# Patient Record
Sex: Male | Born: 1965
Health system: Southern US, Community
[De-identification: ages and names within clinical notes are randomized; demographics above are authoritative.]

## PROBLEM LIST (undated history)

## (undated) ENCOUNTER — Ambulatory Visit (HOSPITAL_COMMUNITY): Discharge status: Absent without leave | Payer: Self-pay | Source: Home / Self Care

## (undated) DIAGNOSIS — F419 Anxiety disorder, unspecified: Secondary | ICD-10-CM

## (undated) DIAGNOSIS — K219 Gastro-esophageal reflux disease without esophagitis: Secondary | ICD-10-CM

## (undated) DIAGNOSIS — F32A Depression, unspecified: Secondary | ICD-10-CM

## (undated) DIAGNOSIS — R011 Cardiac murmur, unspecified: Secondary | ICD-10-CM

## (undated) DIAGNOSIS — F329 Major depressive disorder, single episode, unspecified: Secondary | ICD-10-CM

## (undated) DIAGNOSIS — F1111 Opioid abuse, in remission: Secondary | ICD-10-CM

## (undated) DIAGNOSIS — F1120 Opioid dependence, uncomplicated: Secondary | ICD-10-CM

## (undated) DIAGNOSIS — Z79891 Long term (current) use of opiate analgesic: Secondary | ICD-10-CM

## (undated) HISTORY — PX: NO PAST SURGERIES: SHX2092

---
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
## 2009-10-16 ENCOUNTER — Emergency Department (HOSPITAL_COMMUNITY): Admission: EM | Admit: 2009-10-16 | Discharge: 2009-10-16 | Payer: Self-pay | Admitting: Emergency Medicine

## 2009-12-05 ENCOUNTER — Emergency Department (HOSPITAL_COMMUNITY): Admission: EM | Admit: 2009-12-05 | Discharge: 2009-12-05 | Payer: Self-pay | Admitting: Emergency Medicine

## 2011-03-14 ENCOUNTER — Emergency Department (HOSPITAL_COMMUNITY)
Admission: EM | Admit: 2011-03-14 | Discharge: 2011-03-14 | Disposition: A | Discharge status: Home / Self Care | Payer: Self-pay | Attending: Emergency Medicine | Admitting: Emergency Medicine

## 2011-03-14 ENCOUNTER — Emergency Department (HOSPITAL_COMMUNITY): Payer: Self-pay

## 2011-03-14 DIAGNOSIS — IMO0001 Reserved for inherently not codable concepts without codable children: Secondary | ICD-10-CM | POA: Insufficient documentation

## 2011-03-14 DIAGNOSIS — R12 Heartburn: Secondary | ICD-10-CM | POA: Insufficient documentation

## 2011-03-14 DIAGNOSIS — K8020 Calculus of gallbladder without cholecystitis without obstruction: Secondary | ICD-10-CM | POA: Insufficient documentation

## 2011-03-14 DIAGNOSIS — K219 Gastro-esophageal reflux disease without esophagitis: Secondary | ICD-10-CM | POA: Insufficient documentation

## 2011-03-14 DIAGNOSIS — R112 Nausea with vomiting, unspecified: Secondary | ICD-10-CM | POA: Insufficient documentation

## 2011-03-14 DIAGNOSIS — Z79899 Other long term (current) drug therapy: Secondary | ICD-10-CM | POA: Insufficient documentation

## 2011-03-14 DIAGNOSIS — R109 Unspecified abdominal pain: Secondary | ICD-10-CM | POA: Insufficient documentation

## 2011-03-14 LAB — URINALYSIS, ROUTINE W REFLEX MICROSCOPIC
Glucose, UA: NEGATIVE mg/dL
Ketones, ur: NEGATIVE mg/dL
Nitrite: NEGATIVE
Specific Gravity, Urine: >1.046 — ABNORMAL HIGH (ref 1.005–1.030)
pH: 6 (ref 5.0–8.0)

## 2011-03-14 LAB — DIFFERENTIAL
Basophils Absolute: 0 10*3/uL (ref 0.0–0.1)
Eosinophils Absolute: 0.4 10*3/uL (ref 0.0–0.7)
Eosinophils Relative: 5 % (ref 0–5)
Monocytes Absolute: 0.6 10*3/uL (ref 0.1–1.0)

## 2011-03-14 LAB — CBC
MCHC: 35.6 g/dL (ref 30.0–36.0)
MCV: 88.7 fL (ref 78.0–100.0)
Platelets: 174 10*3/uL (ref 150–400)
RDW: 13.2 % (ref 11.5–15.5)
WBC: 8.8 10*3/uL (ref 4.0–10.5)

## 2011-03-14 LAB — COMPREHENSIVE METABOLIC PANEL
AST: 19 U/L (ref 0–37)
Albumin: 4 g/dL (ref 3.5–5.2)
BUN: 14 mg/dL (ref 6–23)
Chloride: 101 mEq/L (ref 96–112)
Creatinine, Ser: 1.22 mg/dL (ref 0.50–1.35)
Potassium: 3.7 mEq/L (ref 3.5–5.1)
Total Bilirubin: 0.2 mg/dL — ABNORMAL LOW (ref 0.3–1.2)
Total Protein: 7.5 g/dL (ref 6.0–8.3)

## 2011-03-14 LAB — LIPASE, BLOOD: Lipase: 39 U/L (ref 11–59)

## 2011-03-14 MED ORDER — IOHEXOL 300 MG/ML  SOLN
100.0000 mL | Freq: Once | INTRAMUSCULAR | Status: AC | PRN
  Administered 2011-03-14: 100 mL via INTRAVENOUS

## 2015-03-22 ENCOUNTER — Emergency Department (HOSPITAL_COMMUNITY)
Admission: EM | Admit: 2015-03-22 | Discharge: 2015-03-22 | Disposition: A | Discharge status: Home / Self Care | Payer: Self-pay | Attending: Emergency Medicine | Admitting: Emergency Medicine

## 2015-03-22 ENCOUNTER — Encounter (HOSPITAL_COMMUNITY): Payer: Self-pay | Admitting: Emergency Medicine

## 2015-03-22 DIAGNOSIS — E860 Dehydration: Secondary | ICD-10-CM | POA: Insufficient documentation

## 2015-03-22 DIAGNOSIS — Z720 Tobacco use: Secondary | ICD-10-CM | POA: Insufficient documentation

## 2015-03-22 DIAGNOSIS — Z79899 Other long term (current) drug therapy: Secondary | ICD-10-CM | POA: Insufficient documentation

## 2015-03-22 LAB — LIPASE, BLOOD: Lipase: 17 U/L — ABNORMAL LOW (ref 22–51)

## 2015-03-22 LAB — URINALYSIS, ROUTINE W REFLEX MICROSCOPIC
Glucose, UA: NEGATIVE mg/dL
Hgb urine dipstick: NEGATIVE
Ketones, ur: NEGATIVE mg/dL
Leukocytes, UA: NEGATIVE
Nitrite: NEGATIVE
Protein, ur: 30 mg/dL — AB
Specific Gravity, Urine: 1.027 (ref 1.005–1.030)
Urobilinogen, UA: 1 mg/dL (ref 0.0–1.0)
pH: 6.5 (ref 5.0–8.0)

## 2015-03-22 LAB — COMPREHENSIVE METABOLIC PANEL
ALT: 45 U/L (ref 17–63)
AST: 52 U/L — ABNORMAL HIGH (ref 15–41)
Albumin: 4.4 g/dL (ref 3.5–5.0)
Alkaline Phosphatase: 97 U/L (ref 38–126)
Anion gap: 11 (ref 5–15)
BUN: 11 mg/dL (ref 6–20)
CO2: 27 mmol/L (ref 22–32)
Calcium: 10.1 mg/dL (ref 8.9–10.3)
Chloride: 103 mmol/L (ref 101–111)
Creatinine, Ser: 1.44 mg/dL — ABNORMAL HIGH (ref 0.61–1.24)
GFR calc Af Amer: >60 mL/min (ref >60)
GFR calc non Af Amer: 57 mL/min — ABNORMAL LOW (ref >60)
Glucose, Bld: 109 mg/dL — ABNORMAL HIGH (ref 65–99)
Potassium: 4.5 mmol/L (ref 3.5–5.1)
Sodium: 141 mmol/L (ref 135–145)
Total Bilirubin: 1 mg/dL (ref 0.3–1.2)
Total Protein: 9 g/dL — ABNORMAL HIGH (ref 6.5–8.1)

## 2015-03-22 LAB — CBC WITH DIFFERENTIAL/PLATELET
Basophils Absolute: 0 10*3/uL (ref 0.0–0.1)
Basophils Relative: 0 % (ref 0–1)
Eosinophils Absolute: 0 10*3/uL (ref 0.0–0.7)
Eosinophils Relative: 0 % (ref 0–5)
HCT: 48.5 % (ref 39.0–52.0)
Hemoglobin: 16.7 g/dL (ref 13.0–17.0)
Lymphocytes Relative: 15 % (ref 12–46)
Lymphs Abs: 2.1 10*3/uL (ref 0.7–4.0)
MCH: 31 pg (ref 26.0–34.0)
MCHC: 34.4 g/dL (ref 30.0–36.0)
MCV: 90 fL (ref 78.0–100.0)
Monocytes Absolute: 0.8 10*3/uL (ref 0.1–1.0)
Monocytes Relative: 6 % (ref 3–12)
Neutro Abs: 11.3 10*3/uL — ABNORMAL HIGH (ref 1.7–7.7)
Neutrophils Relative %: 79 % — ABNORMAL HIGH (ref 43–77)
Platelets: 212 10*3/uL (ref 150–400)
RBC: 5.39 MIL/uL (ref 4.22–5.81)
RDW: 13.1 % (ref 11.5–15.5)
WBC: 14.3 10*3/uL — ABNORMAL HIGH (ref 4.0–10.5)

## 2015-03-22 LAB — URINE MICROSCOPIC-ADD ON

## 2015-03-22 MED ORDER — ONDANSETRON HCL 4 MG/2ML IJ SOLN
4.0000 mg | Freq: Once | INTRAMUSCULAR | Status: AC
  Administered 2015-03-22: 4 mg via INTRAVENOUS
  Filled 2015-03-22: qty 2

## 2015-03-22 MED ORDER — SODIUM CHLORIDE 0.9 % IV BOLUS (SEPSIS)
1000.0000 mL | Freq: Once | INTRAVENOUS | Status: AC
  Administered 2015-03-22: 1000 mL via INTRAVENOUS

## 2015-03-22 MED ORDER — PROMETHAZINE HCL 25 MG PO TABS: 25.0000 mg | ORAL_TABLET | Freq: Three times a day (TID) | ORAL | Status: DC | PRN

## 2015-03-22 NOTE — ED Notes (Signed)
Pt ambulated without difficulty

## 2015-03-22 NOTE — Discharge Instructions (Signed)
Return here as needed.  Follow-up with your primary care doctor °

## 2015-03-22 NOTE — ED Notes (Signed)
Per EMS, pt from home, pt reports he is a recovering opiate addict, took his methadone with cocaine today and became nauseous and dizzy with diaphoresis.  Pt is A&OX 4.

## 2015-03-22 NOTE — ED Provider Notes (Signed)
CSN: [id number]     Arrival date & time 03/22/15  1747 History   First MD Initiated Contact with Patient 03/22/15 1840     Chief Complaint  Patient presents with  . Nausea  . Dizziness     (Consider location/radiation/quality/duration/timing/severity/associated sxs/prior Treatment) HPI Patient presents to the emergency department with nausea and dizziness that started today.  The patient states that he is on methadone for heroin and opiate usage.  The patient states that that he has not had any chest pain, shortness of breath, vomiting, diarrhea, abdominal pain, weakness, headache, blurred vision, back pain, incontinence, dysuria, bloody stool, hematemesis, anorexia, rash, near syncope or syncope.  The patient states that he did not take any medications prior to arrival.  He was concerned that this was a result of his methadone.  Patient has maintained the same dosage as he has always had.  He states that he had his methadone clinic this morning. History reviewed. No pertinent past medical history. History reviewed. No pertinent past surgical history. No family history on file. History  Substance Use Topics  . Smoking status: Current Every Day Smoker    Types: Cigarettes  . Smokeless tobacco: Not on file  . Alcohol Use: No    Review of Systems All other systems negative except as documented in the HPI. All pertinent positives and negatives as reviewed in the HPI.    Allergies  Review of patient's allergies indicates no known allergies.  Home Medications   Prior to Admission medications   Medication Sig Start Date End Date Taking? Authorizing Provider  citalopram (CELEXA) 40 MG tablet Take 40 mg by mouth daily.   Yes Historical Provider, MD  docusate sodium (COLACE) 100 MG capsule Take 100 mg by mouth daily as needed for mild constipation.   Yes Historical Provider, MD  methadone (DOLOPHINE) 10 MG/5ML solution Take 73 mg by mouth every morning.   Yes Historical Provider, MD   polyethylene glycol (MIRALAX / GLYCOLAX) packet Take 17 g by mouth daily.   Yes Historical Provider, MD   BP 148/86 mmHg  Pulse 95  Temp(Src) 98.8 F (37.1 C) (Oral)  Resp 18  SpO2 96% Physical Exam  Constitutional: He is oriented to person, place, and time. He appears well-developed and well-nourished. No distress.  HENT:  Head: Normocephalic and atraumatic.  Mouth/Throat: Oropharynx is clear and moist.  Eyes: Pupils are equal, round, and reactive to light.  Neck: Normal range of motion. Neck supple.  Cardiovascular: Normal rate, regular rhythm and normal heart sounds.  Exam reveals no gallop and no friction rub.   No murmur heard. Pulmonary/Chest: Effort normal and breath sounds normal. No respiratory distress.  Abdominal: Soft. Bowel sounds are normal. He exhibits no distension. There is no tenderness.  Musculoskeletal: He exhibits no edema.  Neurological: He is alert and oriented to person, place, and time. He exhibits normal muscle tone. Coordination normal.  Skin: Skin is warm and dry. No rash noted. No erythema.  Psychiatric: He has a normal mood and affect. His behavior is normal.  Nursing note and vitals reviewed.   ED Course  Procedures (including critical care time) Labs Review Labs Reviewed  COMPREHENSIVE METABOLIC PANEL - Abnormal; Notable for the following:    Glucose, Bld 109 (*)    Creatinine, Ser 1.44 (*)    Total Protein 9.0 (*)    AST 52 (*)    GFR calc non Af Amer 57 (*)    All other components within normal limits  CBC  WITH DIFFERENTIAL/PLATELET - Abnormal; Notable for the following:    WBC 14.3 (*)    Neutrophils Relative % 79 (*)    Neutro Abs 11.3 (*)    All other components within normal limits  LIPASE, BLOOD - Abnormal; Notable for the following:    Lipase 17 (*)    All other components within normal limits  URINALYSIS, ROUTINE W REFLEX MICROSCOPIC (NOT AT [facility]) - Abnormal; Notable for the following:    Color, Urine AMBER (*)    APPearance  CLOUDY (*)    Bilirubin Urine SMALL (*)    Protein, ur 30 (*)    All other components within normal limits  URINE MICROSCOPIC-ADD ON - Abnormal; Notable for the following:    Casts HYALINE CASTS (*)    All other components within normal limits    Patient has been stable here in the emergency department.  He ambulated without difficulties.  Orthostatics do not show any abnormalities.  A tolerated oral fluids.  I am not exactly sure what caused the patient's nausea and dizziness other than he may be somewhat dehydrated based on his labs.  Patient is advised to increase his fluid intake.  He received 2 L of fluid here in the emergency department    Charlestine Night, PA-C 03/22/15 2246  Eber Hong, MD 03/23/15 1630

## 2018-04-25 NOTE — Congregational Nurse Program (Signed)
New client. Expressed concerns about diabetes.  States runs in his family, but he has never been dx.  Plans to have Dr. Christin FudgeSteele check on next visit

## 2019-12-26 ENCOUNTER — Emergency Department (HOSPITAL_COMMUNITY): Payer: Self-pay

## 2019-12-26 ENCOUNTER — Emergency Department (HOSPITAL_COMMUNITY)
Admission: EM | Admit: 2019-12-26 | Discharge: 2019-12-26 | Disposition: A | Discharge status: Home / Self Care | Payer: Self-pay | Attending: Emergency Medicine | Admitting: Emergency Medicine

## 2019-12-26 ENCOUNTER — Other Ambulatory Visit: Payer: Self-pay

## 2019-12-26 DIAGNOSIS — Y929 Unspecified place or not applicable: Secondary | ICD-10-CM | POA: Insufficient documentation

## 2019-12-26 DIAGNOSIS — Y999 Unspecified external cause status: Secondary | ICD-10-CM | POA: Insufficient documentation

## 2019-12-26 DIAGNOSIS — F17210 Nicotine dependence, cigarettes, uncomplicated: Secondary | ICD-10-CM | POA: Insufficient documentation

## 2019-12-26 DIAGNOSIS — S82441A Displaced spiral fracture of shaft of right fibula, initial encounter for closed fracture: Secondary | ICD-10-CM | POA: Insufficient documentation

## 2019-12-26 DIAGNOSIS — Y9301 Activity, walking, marching and hiking: Secondary | ICD-10-CM | POA: Insufficient documentation

## 2019-12-26 DIAGNOSIS — Z20822 Contact with and (suspected) exposure to covid-19: Secondary | ICD-10-CM | POA: Insufficient documentation

## 2019-12-26 DIAGNOSIS — S82891A Other fracture of right lower leg, initial encounter for closed fracture: Secondary | ICD-10-CM

## 2019-12-26 DIAGNOSIS — X58XXXA Exposure to other specified factors, initial encounter: Secondary | ICD-10-CM | POA: Insufficient documentation

## 2019-12-26 LAB — RESPIRATORY PANEL BY RT PCR (FLU A&B, COVID)
Influenza A by PCR: NEGATIVE
Influenza B by PCR: NEGATIVE
SARS Coronavirus 2 by RT PCR: NEGATIVE

## 2019-12-26 LAB — BASIC METABOLIC PANEL
Anion gap: 9 (ref 5–15)
BUN: 7 mg/dL (ref 6–20)
CO2: 30 mmol/L (ref 22–32)
Calcium: 9.7 mg/dL (ref 8.9–10.3)
Chloride: 94 mmol/L — ABNORMAL LOW (ref 98–111)
Creatinine, Ser: 1.01 mg/dL (ref 0.61–1.24)
GFR calc Af Amer: >60 mL/min (ref >60)
GFR calc non Af Amer: >60 mL/min (ref >60)
Glucose, Bld: 126 mg/dL — ABNORMAL HIGH (ref 70–99)
Potassium: 5 mmol/L (ref 3.5–5.1)
Sodium: 133 mmol/L — ABNORMAL LOW (ref 135–145)

## 2019-12-26 LAB — CBC
HCT: 53 % — ABNORMAL HIGH (ref 39.0–52.0)
Hemoglobin: 16.9 g/dL (ref 13.0–17.0)
MCH: 29.2 pg (ref 26.0–34.0)
MCHC: 31.9 g/dL (ref 30.0–36.0)
MCV: 91.5 fL (ref 80.0–100.0)
Platelets: 194 10*3/uL (ref 150–400)
RBC: 5.79 MIL/uL (ref 4.22–5.81)
RDW: 13.2 % (ref 11.5–15.5)
WBC: 9.3 10*3/uL (ref 4.0–10.5)
nRBC: 0 % (ref 0.0–0.2)

## 2019-12-26 MED ORDER — PROPOFOL 1000 MG/100ML IV EMUL
INTRAVENOUS | Status: AC | PRN
  Administered 2019-12-26: 20 mg via INTRAVENOUS

## 2019-12-26 MED ORDER — PROPOFOL 10 MG/ML IV BOLUS
INTRAVENOUS | Status: AC | PRN
  Administered 2019-12-26: 50 mg via INTRAVENOUS
  Administered 2019-12-26: 200 mg via INTRAVENOUS
  Administered 2019-12-26: 50 mg via INTRAVENOUS

## 2019-12-26 MED ORDER — PROPOFOL 10 MG/ML IV BOLUS
INTRAVENOUS | Status: AC | PRN
  Administered 2019-12-26: 30 mg via INTRAVENOUS
  Administered 2019-12-26: 20 mg via INTRAVENOUS
  Administered 2019-12-26: 50 mg via INTRAVENOUS

## 2019-12-26 MED ORDER — METHADONE HCL 5 MG PO TABS
85.0000 mg | ORAL_TABLET | Freq: Every day | ORAL | Status: DC
  Administered 2019-12-26: 15:00:00 85 mg via ORAL
  Filled 2019-12-26: qty 8

## 2019-12-26 MED ORDER — METHADONE HCL 10 MG/5ML PO SOLN: 73.0000 mg | Freq: Every morning | ORAL | Status: DC

## 2019-12-26 MED ORDER — PROPOFOL 10 MG/ML IV BOLUS
0.5000 mg/kg | Freq: Once | INTRAVENOUS | Status: AC
  Administered 2019-12-26: 12:00:00 50 mg via INTRAVENOUS

## 2019-12-26 MED ORDER — CELECOXIB 200 MG PO CAPS
200.0000 mg | ORAL_CAPSULE | Freq: Two times a day (BID) | ORAL | 0 refills | Status: DC
Start: 2019-12-26 — End: 2020-02-03

## 2019-12-26 MED ORDER — PROPOFOL 10 MG/ML IV BOLUS
0.5000 mg/kg | Freq: Once | INTRAVENOUS | Status: DC
  Filled 2019-12-26: qty 20

## 2019-12-26 NOTE — ED Notes (Signed)
X 1 attempt for IV access unsuccessful.  No other visualized veins.  Have asked another RN to look for IV start.  Pt jovial and restful and this time.  No acute distress.  Ice/ immobilization to extremity

## 2019-12-26 NOTE — ED Provider Notes (Signed)
.Sedation  Date/Time: 12/26/2019 3:08 PM Performed by: Gracy Bruins, MD Authorized by: Heide Scales, MD   Consent:    Consent obtained:  Verbal   Consent given by:  Patient   Risks discussed:  Allergic reaction, dysrhythmia, inadequate sedation, nausea, vomiting, respiratory compromise necessitating ventilatory assistance and intubation and prolonged hypoxia resulting in organ damage   Alternatives discussed:  Analgesia without sedation and regional anesthesia Universal protocol:    Procedure explained and questions answered to patient or proxy's satisfaction: yes     Relevant documents present and verified: yes     Test results available and properly labeled: yes     Imaging studies available: yes     Required blood products, implants, devices, and special equipment available: yes     Site/side marked: yes     Immediately prior to procedure a time out was called: yes     Patient identity confirmation method:  Arm band and verbally with patient Indications:    Procedure performed:  Fracture reduction   Procedure necessitating sedation performed by:  Different physician Pre-sedation assessment:    Time since last food or drink:  11   ASA classification: class 2 - patient with mild systemic disease     Neck mobility: normal     Mouth opening:  3 or more finger widths   Thyromental distance:  3 finger widths   Mallampati score:  II - soft palate, uvula, fauces visible   Pre-sedation assessments completed and reviewed: airway patency, cardiovascular function, hydration status, mental status, nausea/vomiting, pain level and respiratory function     Pre-sedation assessment completed:  12/26/2019 2:50 PM Immediate pre-procedure details:    Reassessment: Patient reassessed immediately prior to procedure     Reviewed: vital signs     Verified: bag valve mask available, emergency equipment available, intubation equipment available, IV patency confirmed, oxygen available and suction  available   Procedure details (see MAR for exact dosages):    Preoxygenation:  Nasal cannula   Sedation:  Propofol   Intended level of sedation: deep   Intra-procedure monitoring:  Blood pressure monitoring, cardiac monitor, continuous capnometry, continuous pulse oximetry, frequent LOC assessments and frequent vital sign checks   Intra-procedure events: none     Total Provider sedation time (minutes):  15 Post-procedure details:    Post-sedation assessment completed:  12/26/2019 3:11 PM   Attendance: Constant attendance by certified staff until patient recovered     Recovery: Patient returned to pre-procedure baseline     Post-sedation assessments completed and reviewed: airway patency, cardiovascular function, hydration status, mental status, nausea/vomiting, pain level and respiratory function     Patient is stable for discharge or admission: yes     Patient tolerance:  Tolerated well, no immediate complications   Care of the patient was assumed from [employer] PA-C at 1500; see this physician's note for complete history of present illness, review of systems, and physical exam.  Briefly, the patient is a 54 y.o. male who presented to the ED with R ankle pain.   Plan at time of handoff:  -Follow up post reduction films -Anticipate likely discharge  MDM/ED Course: On my initial exam, the patient was stable s/p sedation.    Per Ortho patient is safe and stable for discharge with a plan for follow-up with orthopedic surgery on 12/29/2019.  Patient is discharged with crutches.  Patient seen in conjunction with the attending physician, Melene Plan, MD, who participated in all aspects of the patient's care and was  in agreement with the above plan.   Emergency Department Medication Summary: Medications  methadone (DOLOPHINE) tablet 85 mg (85 mg Oral Given 12/26/19 1514)  propofol (DIPRIVAN) 10 mg/mL bolus/IV push 54.5 mg (54.5 mg Intravenous Not Given 12/26/19 1511)  propofol (DIPRIVAN) 10  mg/mL bolus/IV push 54.5 mg (50 mg Intravenous Given 12/26/19 1150)  propofol (DIPRIVAN) 10 mg/mL bolus/IV push (30 mg Intravenous Given 12/26/19 1149)  propofol (DIPRIVAN) 1000 MG/100ML infusion ( Intravenous Stopped 12/26/19 1155)  propofol (DIPRIVAN) 10 mg/mL bolus/IV push (200 mg Intravenous Given 12/26/19 1504)   Clinical Impression: No diagnosis found.   Filbert Berthold, MD 12/26/19 1618    Tegeler, Gwenyth Allegra, MD 12/29/19 [phone]

## 2019-12-26 NOTE — Consult Note (Signed)
Reason for Consult:Right ankle fx Referring Physician: C Tegeler  Gene Gonzalez is an 54 y.o. male.  HPI: Gene Gonzalez was in his kitchen and pivoted on his right foot. He's not sure what happened but heard a pop and had severe pain. He could not bear weight after that. He came to the ED where x-rays showed an ankle fx and orthopedic surgery was consulted. He is currently unemployed.  No past medical history on file.  No past surgical history on file.  No family history on file.  Social History:  reports that he has been smoking cigarettes. He does not have any smokeless tobacco history on file. He reports that he does not drink alcohol. No history on file for drug.  Allergies: No Known Allergies  Medications: I have reviewed the patient's current medications.  No results found for this or any previous visit (from the past 48 hour(s)).  DG Ankle Complete Right  Result Date: 12/26/2019 CLINICAL DATA:  54 year old male status post injury with pain. EXAM: RIGHT ANKLE - COMPLETE 3+ VIEW COMPARISON:  None. FINDINGS: Spiral fracture through the distal right fibula metadiaphysis tracking into the mortise joint. That fracture is laterally displaced, angulated and mildly overriding. Lateral and slightly posterior dislocation of the mortise joint. Evidence of associated minimally displaced posterior malleolus fracture. The medial malleolus appears to remain intact. Talar dome and calcaneus appear intact. Other visible bones of the right foot appear intact. IMPRESSION: Right ankle fractures with displaced spiral fracture through the distal right fibula metadiaphysis, and minimally displaced posterior malleolus fracture. Lateral and slightly posterior ankle dislocation. Electronically Signed   By: Genevie Ann M.D.   On: 12/26/2019 04:17    Review of Systems  HENT: Negative for ear discharge, ear pain, hearing loss and tinnitus.   Eyes: Negative for photophobia and pain.  Respiratory: Negative for cough and  shortness of breath.   Cardiovascular: Negative for chest pain.  Gastrointestinal: Negative for abdominal pain, nausea and vomiting.  Genitourinary: Negative for dysuria, flank pain, frequency and urgency.  Musculoskeletal: Positive for arthralgias (Right ankle pain). Negative for back pain, myalgias and neck pain.  Neurological: Negative for dizziness and headaches.  Hematological: Does not bruise/bleed easily.  Psychiatric/Behavioral: The patient is not nervous/anxious.    Blood pressure (!) 144/97, pulse 87, temperature 99.1 F (37.3 C), temperature source Oral, resp. rate 14, SpO2 93 %. Physical Exam  Constitutional: He appears well-developed and well-nourished. No distress.  HENT:  Head: Normocephalic and atraumatic.  Eyes: Conjunctivae are normal. Right eye exhibits no discharge. Left eye exhibits no discharge. No scleral icterus.  Cardiovascular: Normal rate and regular rhythm.  Respiratory: Effort normal. No respiratory distress.  Musculoskeletal:     Cervical back: Normal range of motion.     Comments: RLE No traumatic wounds, ecchymosis, or rash  Severe TTP ankle, deformed, mod edema  No knee effusion  Knee stable to varus/ valgus and anterior/posterior stress  Sens DPN, SPN, TN intact  Motor EHL 5/5  DP 2+, PT 1+, No significant pedal edema  Neurological: He is alert.  Skin: Skin is warm and dry. He is not diaphoretic.  Psychiatric: He has a normal mood and affect. His behavior is normal.    Assessment/Plan: Right ankle fx -- EDP to CR and splint. Stressed importance of reduction of edema prior to ORIF. He should go home with NWB, elevation, and icing and return to clinic in ~10d with Dr. Marcelino Scot. Methadone use for addiction Tobacco use    Lisette Abu,  PA-C Orthopedic Surgery [phone] 12/26/2019, 8:52 AM

## 2019-12-26 NOTE — ED Provider Notes (Signed)
[facility] EMERGENCY DEPARTMENT Provider Note   CSN: [id number] Arrival date & time: 12/26/19  0334     History Chief Complaint  Patient presents with  . Ankle Pain    Right    Gene Gonzalez is a 54 y.o. male with no significant past medical history.  He is on methadone maintenance therapy.  The patient states that he was walking in his socks in the kitchen last night.  He felt a snap in his leg and had immediate severe pain, deformity and inability to ambulate on his right ankle.  He was brought in by EMS.  He denies numbness or tingling of the right foot. He denies any lacerations or open wounds and states that it was checked fully by EMS prior to arrival.  HPI     No past medical history on file.  There are no problems to display for this patient.   No past surgical history on file.     No family history on file.  Social History   Tobacco Use  . Smoking status: Current Every Day Smoker    Types: Cigarettes  Substance Use Topics  . Alcohol use: No  . Drug use: Not on file    Home Medications Prior to Admission medications   Medication Sig Start Date End Date Taking? Authorizing Provider  citalopram (CELEXA) 40 MG tablet Take 40 mg by mouth daily.    [provider]  docusate sodium (COLACE) 100 MG capsule Take 100 mg by mouth daily as needed for mild constipation.    [provider]  methadone (DOLOPHINE) 10 MG/5ML solution Take 73 mg by mouth every morning.    [provider]  polyethylene glycol (MIRALAX / GLYCOLAX) packet Take 17 g by mouth daily.    [provider]  promethazine (PHENERGAN) 25 MG tablet Take 1 tablet (25 mg total) by mouth every 8 (eight) hours as needed for nausea or vomiting. 03/22/15   Charlestine Night, PA-C    Allergies    Patient has no known allergies.  Review of Systems   Review of Systems Ten systems reviewed and are negative for acute change, except as noted in the HPI.    Physical Exam Updated Vital Signs BP 124/82 (BP Location: Left Arm)   Pulse 87   Temp 99.1 F (37.3 C) (Oral)   Resp 20   SpO2 96%   Physical Exam Vitals and nursing note reviewed.  Constitutional:      General: He is not in acute distress.    Appearance: He is well-developed. He is not diaphoretic.  HENT:     Head: Normocephalic and atraumatic.  Eyes:     General: No scleral icterus.    Conjunctiva/sclera: Conjunctivae normal.  Cardiovascular:     Rate and Rhythm: Normal rate and regular rhythm.     Heart sounds: Normal heart sounds.  Pulmonary:     Effort: Pulmonary effort is normal. No respiratory distress.     Breath sounds: Normal breath sounds.  Abdominal:     Palpations: Abdomen is soft.     Tenderness: There is no abdominal tenderness.  Musculoskeletal:     Cervical back: Normal range of motion and neck supple.     Comments: Right ankle examined in splint. There is a palpable pulse, cap refill less than 3 seconds.  Skin:    General: Skin is warm and dry.  Neurological:     Mental Status: He is alert.  Psychiatric:  Behavior: Behavior normal.     ED Results / Procedures / Treatments   Labs (all labs ordered are listed, but only abnormal results are displayed) Labs Reviewed - No data to display  EKG None  Radiology DG Ankle Complete Right  Result Date: 12/26/2019 CLINICAL DATA:  54 year old male status post injury with pain. EXAM: RIGHT ANKLE - COMPLETE 3+ VIEW COMPARISON:  None. FINDINGS: Spiral fracture through the distal right fibula metadiaphysis tracking into the mortise joint. That fracture is laterally displaced, angulated and mildly overriding. Lateral and slightly posterior dislocation of the mortise joint. Evidence of associated minimally displaced posterior malleolus fracture. The medial malleolus appears to remain intact. Talar dome and calcaneus appear intact. Other visible bones of the right foot appear intact. IMPRESSION: Right ankle  fractures with displaced spiral fracture through the distal right fibula metadiaphysis, and minimally displaced posterior malleolus fracture. Lateral and slightly posterior ankle dislocation. Electronically Signed   By: Genevie Ann M.D.   On: 12/26/2019 04:17    Procedures Reduction of fracture  Date/Time: 12/26/2019 6:28 PM Performed by: Margarita Mail, PA-C Authorized by: Margarita Mail, PA-C  Consent: Verbal consent obtained. Written consent obtained. Risks and benefits: risks, benefits and alternatives were discussed Consent given by: patient Patient understanding: patient states understanding of the procedure being performed Patient consent: the patient's understanding of the procedure matches consent given Procedure consent: procedure consent matches procedure scheduled Patient identity confirmed: verbally with patient and provided demographic data Time out: Immediately prior to procedure a "time out" was called to verify the correct patient, procedure, equipment, support staff and site/side marked as required.  Sedation: Patient sedated: yes (see note by Dr. Sherry Ruffing)  Patient tolerance: patient tolerated the procedure well with no immediate complications  .Splint Application  Date/Time: 12/26/2019 6:30 PM Performed by: Janit Pagan Authorized by: Margarita Mail, PA-C   Consent:    Consent obtained:  Verbal   Consent given by:  Patient   Risks discussed:  Discoloration, numbness and pain   Alternatives discussed:  No treatment Pre-procedure details:    Sensation:  Normal Procedure details:    Laterality:  Right   Location:  Leg   Leg:  L lower leg   Strapping: yes     Splint type:  Short leg (posterior and stirrup) Post-procedure details:    Pain:  Improved   Patient tolerance of procedure:  Tolerated well, no immediate complications   (including critical care time)  Medications Ordered in ED Medications - No data to display  ED Course  I have reviewed the  triage vital signs and the nursing notes.  Pertinent labs & imaging results that were available during my care of the patient were reviewed by me and considered in my medical decision making (see chart for details).    MDM Rules/Calculators/A&P                      54 year old male here with right lower extremity fracture after mechanical fall in his home.  He has had an extensive wait time here in the emergency department apartment unfortunately.  I reviewed the patient's complete ankle x-ray including the plain films visually.  He has an obvious unstable ankle fracture with posterior dislocation involving a spiral fracture of the fibula.  It is also involving the articular surface with disruption of the mortise.  The patient is neurovascularly intact on examination.  He does have some tenting over the medial malleolus.  The patient was sedated after consent.  I was able to improve alignment of the ankle by about 80% however after speaking with Charma Igo about the postreduction film he felt it needed realignment and so a second procedure was performed with may be some mild improvement.  Dr. Carola Frost then reviewed the films and decided the patient could be discharged home on crutches.  He will see Dr. Carola Frost this coming Monday, in 3 days.  Patient is neurovascularly intact after both procedures and splint placements.  I personally ordered reviewed and interpreted the patient's labs which show a mildly elevated blood glucose, CBC without significant abnormality.  Patient patient's respiratory panel is negative for Covid or influenza.  Patient has had little need for pain control as long as his leg is still.  He is on chronic methadone therapy and I have ordered his regular daily dose.  Has well-controlled pain.  Is neurovascularly intact after splint application.  He appears appropriate for discharge at this time. Final Clinical Impression(s) / ED Diagnoses Final diagnoses:  None    Rx / DC Orders ED  Discharge Orders    None       Arthor Captain, PA-C 12/26/19 1835    Tegeler, Canary Brim, MD 12/29/19 [phone]

## 2019-12-26 NOTE — ED Notes (Signed)
Second sedation completed and pt awake and able to respond appropriately.

## 2019-12-26 NOTE — Progress Notes (Signed)
Orthopedic Tech Progress Note Patient Details:  Gene Gonzalez 29-Oct-1965 [id number] Had to redo reduction and reapplied a new splint Ortho Devices Type of Ortho Device: Short leg splint, Stirrup splint Ortho Device/Splint Location: RLE Ortho Device/Splint Interventions: Application, Removal   Post Interventions Patient Tolerated: Well Instructions Provided: Care of device   [name] 12/26/2019, 3:11 PM

## 2019-12-26 NOTE — Discharge Instructions (Addendum)
Keep splint intact and dry.  Do not put weight down on your right leg.  Keep leg elevated whenever possible.  Ice (over splint) for 30 minutes 4x/day.

## 2019-12-26 NOTE — Progress Notes (Signed)
Orthopedic Tech Progress Note Patient Details:  Gene Gonzalez 1966-08-22 [id number] MD did an ankle reduction and I applied the splint Ortho Devices Type of Ortho Device: Short leg splint, Stirrup splint Ortho Device/Splint Location: RLE Ortho Device/Splint Interventions: Application   Post Interventions Patient Tolerated: Well Instructions Provided: Care of device   [name] 12/26/2019, 12:06 PM

## 2019-12-26 NOTE — Procedures (Addendum)
Procedure: Right ankle closed reduction   Indication: Right ankle fracture   Surgeon: Charma Igo, PA-C   Assist: None   Anesthesia: Conscious sedation with propofol via EDP   EBL: None   Complications: None   Findings: After risks/benefits explained patient desires to undergo procedure. Consent obtained and time out performed. Sedation given. Ankle reduced and splinted. Pt tolerated the procedure well. X-rays pending.       Gene Caldron, PA-C Orthopedic Surgery [phone]   The service was rendered under my overall direction and control, and I was immediately available via phone/ pager or present on site. We will follow up x-rays to see how to proceed.  Myrene Galas, MD [facility] [phone] (p)

## 2019-12-29 NOTE — ED Provider Notes (Signed)
.  Sedation  Date/Time: 12/26/2019 4:45 PM Performed by: Heide Scales, MD Authorized by: Heide Scales, MD   Consent:    Consent obtained:  Verbal   Consent given by:  Patient   Risks discussed:  Allergic reaction, dysrhythmia, inadequate sedation, nausea, prolonged hypoxia resulting in organ damage, prolonged sedation necessitating reversal, respiratory compromise necessitating ventilatory assistance and intubation and vomiting   Alternatives discussed:  Analgesia without sedation, anxiolysis and regional anesthesia Universal protocol:    Procedure explained and questions answered to patient or proxy's satisfaction: yes     Relevant documents present and verified: yes     Test results available and properly labeled: yes     Imaging studies available: yes     Required blood products, implants, devices, and special equipment available: yes     Site/side marked: yes     Immediately prior to procedure a time out was called: yes     Patient identity confirmation method:  Verbally with patient Indications:    Procedure performed:  Fracture reduction   Procedure necessitating sedation performed by:  Physician performing sedation Pre-sedation assessment:    Time since last food or drink:  Hours   ASA classification: class 1 - normal, healthy patient     Neck mobility: normal     Mouth opening:  3 or more finger widths   Thyromental distance:  4 finger widths   Mallampati score:  I - soft palate, uvula, fauces, pillars visible   Pre-sedation assessments completed and reviewed: airway patency, cardiovascular function, hydration status, mental status, nausea/vomiting, pain level, respiratory function and temperature   Immediate pre-procedure details:    Reassessment: Patient reassessed immediately prior to procedure     Reviewed: vital signs, relevant labs/tests and NPO status     Verified: bag valve mask available, emergency equipment available, intubation equipment  available, IV patency confirmed, oxygen available and suction available   Procedure details (see MAR for exact dosages):    Preoxygenation:  Nasal cannula   Sedation:  Propofol   Intended level of sedation: deep   Intra-procedure monitoring:  Blood pressure monitoring, cardiac monitor, continuous pulse oximetry, frequent LOC assessments, frequent vital sign checks and continuous capnometry   Intra-procedure events: none     Intra-procedure management:  Airway repositioning   Total Provider sedation time (minutes):  30 Post-procedure details:    Attendance: Constant attendance by certified staff until patient recovered     Recovery: Patient returned to pre-procedure baseline     Post-sedation assessments completed and reviewed: airway patency, cardiovascular function, hydration status, mental status, nausea/vomiting, pain level, respiratory function and temperature     Patient is stable for discharge or admission: yes     Patient tolerance:  Tolerated well, no immediate complications      Jaeden Westbay, Canary Brim, MD 12/29/19 1645

## 2020-01-07 NOTE — Progress Notes (Signed)
Unable to reach patient by phone. Left message with pre-op instructions regarding npo after midnight, meds to take DOS (Celexa, Methadone, Protonix, and Effexor). Smoking cessation twenty four hours prior to surgery. Directions to hospital. Plan for covid test DOS in pre-op. Arrival time 1200.

## 2020-01-08 ENCOUNTER — Ambulatory Visit (HOSPITAL_COMMUNITY): Payer: Self-pay

## 2020-01-08 ENCOUNTER — Other Ambulatory Visit: Payer: Self-pay

## 2020-01-08 ENCOUNTER — Encounter (HOSPITAL_COMMUNITY): Payer: Self-pay | Admitting: Orthopedic Surgery

## 2020-01-08 ENCOUNTER — Ambulatory Visit (HOSPITAL_COMMUNITY)
Admission: RE | Admit: 2020-01-08 | Discharge: 2020-01-09 | Disposition: A | Discharge status: Home / Self Care | Payer: Self-pay | Attending: Orthopedic Surgery | Admitting: Orthopedic Surgery

## 2020-01-08 ENCOUNTER — Encounter (HOSPITAL_COMMUNITY)
Admission: RE | Disposition: A | Discharge status: Home / Self Care | Payer: Self-pay | Source: Home / Self Care | Attending: Orthopedic Surgery

## 2020-01-08 DIAGNOSIS — Z20822 Contact with and (suspected) exposure to covid-19: Secondary | ICD-10-CM | POA: Insufficient documentation

## 2020-01-08 DIAGNOSIS — F1911 Other psychoactive substance abuse, in remission: Secondary | ICD-10-CM | POA: Insufficient documentation

## 2020-01-08 DIAGNOSIS — X58XXXA Exposure to other specified factors, initial encounter: Secondary | ICD-10-CM | POA: Insufficient documentation

## 2020-01-08 DIAGNOSIS — F32A Depression, unspecified: Secondary | ICD-10-CM | POA: Diagnosis present

## 2020-01-08 DIAGNOSIS — E669 Obesity, unspecified: Secondary | ICD-10-CM | POA: Insufficient documentation

## 2020-01-08 DIAGNOSIS — S82851A Displaced trimalleolar fracture of right lower leg, initial encounter for closed fracture: Secondary | ICD-10-CM | POA: Diagnosis present

## 2020-01-08 DIAGNOSIS — F1120 Opioid dependence, uncomplicated: Secondary | ICD-10-CM | POA: Diagnosis present

## 2020-01-08 DIAGNOSIS — Y9389 Activity, other specified: Secondary | ICD-10-CM | POA: Insufficient documentation

## 2020-01-08 DIAGNOSIS — S93431A Sprain of tibiofibular ligament of right ankle, initial encounter: Secondary | ICD-10-CM | POA: Diagnosis present

## 2020-01-08 DIAGNOSIS — T148XXA Other injury of unspecified body region, initial encounter: Secondary | ICD-10-CM

## 2020-01-08 DIAGNOSIS — F1111 Opioid abuse, in remission: Secondary | ICD-10-CM | POA: Diagnosis present

## 2020-01-08 DIAGNOSIS — F17200 Nicotine dependence, unspecified, uncomplicated: Secondary | ICD-10-CM | POA: Insufficient documentation

## 2020-01-08 DIAGNOSIS — S9304XA Dislocation of right ankle joint, initial encounter: Secondary | ICD-10-CM | POA: Diagnosis present

## 2020-01-08 DIAGNOSIS — Z419 Encounter for procedure for purposes other than remedying health state, unspecified: Secondary | ICD-10-CM

## 2020-01-08 DIAGNOSIS — Z6835 Body mass index (BMI) 35.0-35.9, adult: Secondary | ICD-10-CM | POA: Insufficient documentation

## 2020-01-08 DIAGNOSIS — E559 Vitamin D deficiency, unspecified: Secondary | ICD-10-CM | POA: Diagnosis present

## 2020-01-08 DIAGNOSIS — F329 Major depressive disorder, single episode, unspecified: Secondary | ICD-10-CM | POA: Insufficient documentation

## 2020-01-08 DIAGNOSIS — F17210 Nicotine dependence, cigarettes, uncomplicated: Secondary | ICD-10-CM | POA: Insufficient documentation

## 2020-01-08 HISTORY — DX: Long term (current) use of opiate analgesic: Z79.891

## 2020-01-08 HISTORY — DX: Depression, unspecified: F32.A

## 2020-01-08 HISTORY — DX: Cardiac murmur, unspecified: R01.1

## 2020-01-08 HISTORY — DX: Opioid abuse, in remission: F11.11

## 2020-01-08 HISTORY — DX: Opioid dependence, uncomplicated: F11.20

## 2020-01-08 HISTORY — PX: ORIF ANKLE FRACTURE: SHX5408

## 2020-01-08 LAB — CBC
HCT: 44.7 % (ref 39.0–52.0)
Hemoglobin: 14.4 g/dL (ref 13.0–17.0)
MCH: 29.9 pg (ref 26.0–34.0)
MCHC: 32.2 g/dL (ref 30.0–36.0)
MCV: 92.7 fL (ref 80.0–100.0)
Platelets: 201 10*3/uL (ref 150–400)
RBC: 4.82 MIL/uL (ref 4.22–5.81)
RDW: 12.9 % (ref 11.5–15.5)
WBC: 7.6 10*3/uL (ref 4.0–10.5)
nRBC: 0 % (ref 0.0–0.2)

## 2020-01-08 LAB — RESPIRATORY PANEL BY RT PCR (FLU A&B, COVID)
Influenza A by PCR: NEGATIVE
Influenza B by PCR: NEGATIVE
SARS Coronavirus 2 by RT PCR: NEGATIVE

## 2020-01-08 LAB — CREATININE, SERUM
Creatinine, Ser: 1.05 mg/dL (ref 0.61–1.24)
GFR calc Af Amer: >60 mL/min (ref >60)
GFR calc non Af Amer: >60 mL/min (ref >60)

## 2020-01-08 SURGERY — OPEN REDUCTION INTERNAL FIXATION (ORIF) ANKLE FRACTURE
Anesthesia: Regional | Site: Ankle | Laterality: Right

## 2020-01-08 MED ORDER — METOCLOPRAMIDE HCL 5 MG PO TABS: 5.0000 mg | ORAL_TABLET | Freq: Three times a day (TID) | ORAL | Status: DC | PRN

## 2020-01-08 MED ORDER — HYDROCODONE-ACETAMINOPHEN 7.5-325 MG PO TABS: 1.0000 | ORAL_TABLET | Freq: Four times a day (QID) | ORAL | Status: DC | PRN

## 2020-01-08 MED ORDER — PROPOFOL 10 MG/ML IV BOLUS
INTRAVENOUS | Status: DC | PRN
  Administered 2020-01-08: 200 mg via INTRAVENOUS

## 2020-01-08 MED ORDER — FENTANYL CITRATE (PF) 250 MCG/5ML IJ SOLN
INTRAMUSCULAR | Status: AC
  Filled 2020-01-08: qty 5

## 2020-01-08 MED ORDER — DEXTROSE 5 % IV SOLN
INTRAVENOUS | Status: DC | PRN
  Administered 2020-01-08: 3 g via INTRAVENOUS

## 2020-01-08 MED ORDER — DOXEPIN HCL 25 MG PO CAPS
75.0000 mg | ORAL_CAPSULE | Freq: Every day | ORAL | Status: DC
  Administered 2020-01-08: 75 mg via ORAL
  Filled 2020-01-08: qty 1
  Filled 2020-01-08 (×2): qty 3

## 2020-01-08 MED ORDER — MIDAZOLAM HCL 2 MG/2ML IJ SOLN: 2.0000 mg | Freq: Once | INTRAMUSCULAR | Status: AC

## 2020-01-08 MED ORDER — SUGAMMADEX SODIUM 200 MG/2ML IV SOLN
INTRAVENOUS | Status: DC | PRN
  Administered 2020-01-08: 200 mg via INTRAVENOUS

## 2020-01-08 MED ORDER — FENTANYL CITRATE (PF) 100 MCG/2ML IJ SOLN
INTRAMUSCULAR | Status: AC
  Administered 2020-01-08: 50 ug via INTRAVENOUS
  Filled 2020-01-08: qty 2

## 2020-01-08 MED ORDER — ONDANSETRON HCL 4 MG PO TABS: 4.0000 mg | ORAL_TABLET | Freq: Four times a day (QID) | ORAL | Status: DC | PRN

## 2020-01-08 MED ORDER — BUPIVACAINE HCL 0.5 % IJ SOLN
INTRAMUSCULAR | Status: DC | PRN
  Administered 2020-01-08 (×2): 20 mL

## 2020-01-08 MED ORDER — EPHEDRINE SULFATE 50 MG/ML IJ SOLN
INTRAMUSCULAR | Status: DC | PRN
Start: 2020-01-08 — End: 2020-01-08
  Administered 2020-01-08 (×2): 5 mg via INTRAVENOUS

## 2020-01-08 MED ORDER — MIDAZOLAM HCL 2 MG/2ML IJ SOLN
INTRAMUSCULAR | Status: AC
  Administered 2020-01-08: 2 mg via INTRAVENOUS
  Filled 2020-01-08: qty 2

## 2020-01-08 MED ORDER — ONDANSETRON HCL 4 MG/2ML IJ SOLN
INTRAMUSCULAR | Status: DC | PRN
  Administered 2020-01-08: 4 mg via INTRAVENOUS

## 2020-01-08 MED ORDER — METHOCARBAMOL 750 MG PO TABS
750.0000 mg | ORAL_TABLET | Freq: Three times a day (TID) | ORAL | Status: DC
  Administered 2020-01-08 – 2020-01-09 (×2): 750 mg via ORAL
  Filled 2020-01-08 (×2): qty 1

## 2020-01-08 MED ORDER — PROPOFOL 10 MG/ML IV BOLUS
INTRAVENOUS | Status: AC
  Filled 2020-01-08: qty 20

## 2020-01-08 MED ORDER — ENOXAPARIN SODIUM 40 MG/0.4ML ~~LOC~~ SOLN
40.0000 mg | Freq: Every day | SUBCUTANEOUS | Status: DC
  Administered 2020-01-09: 40 mg via SUBCUTANEOUS
  Filled 2020-01-08 (×2): qty 0.4

## 2020-01-08 MED ORDER — ACETAMINOPHEN 500 MG PO TABS
500.0000 mg | ORAL_TABLET | Freq: Two times a day (BID) | ORAL | Status: DC
  Administered 2020-01-08 – 2020-01-09 (×2): 500 mg via ORAL
  Filled 2020-01-08 (×2): qty 1

## 2020-01-08 MED ORDER — TRAMADOL HCL 50 MG PO TABS: 50.0000 mg | ORAL_TABLET | Freq: Four times a day (QID) | ORAL | Status: DC | PRN

## 2020-01-08 MED ORDER — ACETAMINOPHEN 325 MG PO TABS: 325.0000 mg | ORAL_TABLET | Freq: Four times a day (QID) | ORAL | Status: DC | PRN

## 2020-01-08 MED ORDER — HYDROMORPHONE HCL 1 MG/ML IJ SOLN: 0.2500 mg | INTRAMUSCULAR | Status: DC | PRN

## 2020-01-08 MED ORDER — ONDANSETRON HCL 4 MG/2ML IJ SOLN: 4.0000 mg | Freq: Four times a day (QID) | INTRAMUSCULAR | Status: DC | PRN

## 2020-01-08 MED ORDER — POTASSIUM CHLORIDE IN NACL 20-0.9 MEQ/L-% IV SOLN: INTRAVENOUS | Status: DC

## 2020-01-08 MED ORDER — MORPHINE SULFATE (PF) 2 MG/ML IV SOLN: 0.5000 mg | INTRAVENOUS | Status: DC | PRN

## 2020-01-08 MED ORDER — LIDOCAINE 2% (20 MG/ML) 5 ML SYRINGE
INTRAMUSCULAR | Status: DC | PRN
  Administered 2020-01-08: 40 mg via INTRAVENOUS

## 2020-01-08 MED ORDER — PHENYLEPHRINE HCL (PRESSORS) 10 MG/ML IV SOLN
INTRAVENOUS | Status: DC | PRN
  Administered 2020-01-08: 120 ug via INTRAVENOUS
  Administered 2020-01-08: 80 ug via INTRAVENOUS
  Administered 2020-01-08: 40 ug via INTRAVENOUS
  Administered 2020-01-08: 80 ug via INTRAVENOUS

## 2020-01-08 MED ORDER — HYDROCODONE-ACETAMINOPHEN 5-325 MG PO TABS: 1.0000 | ORAL_TABLET | Freq: Four times a day (QID) | ORAL | Status: DC | PRN

## 2020-01-08 MED ORDER — DOCUSATE SODIUM 100 MG PO CAPS
100.0000 mg | ORAL_CAPSULE | Freq: Two times a day (BID) | ORAL | Status: DC
  Administered 2020-01-08 – 2020-01-09 (×2): 100 mg via ORAL
  Filled 2020-01-08 (×2): qty 1

## 2020-01-08 MED ORDER — METOCLOPRAMIDE HCL 5 MG/ML IJ SOLN: 5.0000 mg | Freq: Three times a day (TID) | INTRAMUSCULAR | Status: DC | PRN

## 2020-01-08 MED ORDER — VENLAFAXINE HCL ER 75 MG PO CP24
150.0000 mg | ORAL_CAPSULE | Freq: Every day | ORAL | Status: DC
  Administered 2020-01-09: 150 mg via ORAL
  Filled 2020-01-08: qty 2

## 2020-01-08 MED ORDER — CITALOPRAM HYDROBROMIDE 20 MG PO TABS
40.0000 mg | ORAL_TABLET | Freq: Every day | ORAL | Status: DC
  Administered 2020-01-09: 40 mg via ORAL
  Filled 2020-01-08: qty 2

## 2020-01-08 MED ORDER — ROCURONIUM BROMIDE 10 MG/ML (PF) SYRINGE
PREFILLED_SYRINGE | INTRAVENOUS | Status: DC | PRN
  Administered 2020-01-08: 60 mg via INTRAVENOUS
  Administered 2020-01-08: 40 mg via INTRAVENOUS
  Administered 2020-01-08 (×2): 20 mg via INTRAVENOUS

## 2020-01-08 MED ORDER — DEXMEDETOMIDINE HCL 200 MCG/2ML IV SOLN
INTRAVENOUS | Status: DC | PRN
  Administered 2020-01-08: 20 ug via INTRAVENOUS

## 2020-01-08 MED ORDER — PANTOPRAZOLE SODIUM 20 MG PO TBEC
20.0000 mg | DELAYED_RELEASE_TABLET | Freq: Every day | ORAL | Status: DC
  Administered 2020-01-09: 20 mg via ORAL
  Filled 2020-01-08: qty 1

## 2020-01-08 MED ORDER — CEFAZOLIN SODIUM-DEXTROSE 2-4 GM/100ML-% IV SOLN
2.0000 g | Freq: Four times a day (QID) | INTRAVENOUS | Status: DC
  Administered 2020-01-08 – 2020-01-09 (×2): 2 g via INTRAVENOUS
  Filled 2020-01-08 (×2): qty 100

## 2020-01-08 MED ORDER — MIDAZOLAM HCL 2 MG/2ML IJ SOLN
INTRAMUSCULAR | Status: AC
  Filled 2020-01-08: qty 2

## 2020-01-08 MED ORDER — METHADONE HCL 10 MG PO TABS
85.0000 mg | ORAL_TABLET | Freq: Every day | ORAL | Status: DC
  Administered 2020-01-09: 85 mg via ORAL
  Filled 2020-01-08: qty 9

## 2020-01-08 MED ORDER — BUPIVACAINE LIPOSOME 1.3 % IJ SUSP
INTRAMUSCULAR | Status: DC | PRN
  Administered 2020-01-08: 10 mL via PERINEURAL

## 2020-01-08 MED ORDER — VITAMIN D (ERGOCALCIFEROL) 1.25 MG (50000 UNIT) PO CAPS: 50000.0000 [IU] | ORAL_CAPSULE | ORAL | Status: DC

## 2020-01-08 MED ORDER — DEXAMETHASONE SODIUM PHOSPHATE 10 MG/ML IJ SOLN
INTRAMUSCULAR | Status: DC | PRN
Start: 2020-01-08 — End: 2020-01-08
  Administered 2020-01-08: 10 mg via INTRAVENOUS

## 2020-01-08 MED ORDER — LACTATED RINGERS IV SOLN: INTRAVENOUS | Status: DC | PRN

## 2020-01-08 MED ORDER — FENTANYL CITRATE (PF) 100 MCG/2ML IJ SOLN
INTRAMUSCULAR | Status: DC | PRN
  Administered 2020-01-08 (×3): 50 ug via INTRAVENOUS
  Administered 2020-01-08: 100 ug via INTRAVENOUS

## 2020-01-08 MED ORDER — 0.9 % SODIUM CHLORIDE (POUR BTL) OPTIME
TOPICAL | Status: DC | PRN
  Administered 2020-01-08: 1000 mL

## 2020-01-08 MED ORDER — FENTANYL CITRATE (PF) 100 MCG/2ML IJ SOLN: 50.0000 ug | Freq: Once | INTRAMUSCULAR | Status: AC

## 2020-01-08 MED ORDER — METHOCARBAMOL 1000 MG/10ML IJ SOLN
500.0000 mg | Freq: Three times a day (TID) | INTRAVENOUS | Status: DC
  Filled 2020-01-08 (×3): qty 5

## 2020-01-08 SURGICAL SUPPLY — 94 items
BANDAGE ESMARK 6X9 LF (GAUZE/BANDAGES/DRESSINGS) ×1 IMPLANT
BIT DRILL 2.4X140 LONG SOLID (BIT) ×3 IMPLANT
BIT DRILL 2.8 (BIT) ×1
BIT DRILL 2.8MM (BIT) ×1
BIT DRILL LNG 140X2.8XSLD (BIT) ×1 IMPLANT
BIT DRILL SOLID 2.0 X 110MM (DRILL) ×1 IMPLANT
BIT DRILL SURGICAL 2.7 TUFFNEK (BIT) ×1 IMPLANT
BIT DRL LNG 140X2.8XSLD (BIT) ×1
BNDG ELASTIC 4X5.8 VLCR STR LF (GAUZE/BANDAGES/DRESSINGS) ×3 IMPLANT
BNDG ELASTIC 6X5.8 VLCR STR LF (GAUZE/BANDAGES/DRESSINGS) ×3 IMPLANT
BNDG ESMARK 6X9 LF (GAUZE/BANDAGES/DRESSINGS) ×3
BNDG GAUZE ELAST 4 BULKY (GAUZE/BANDAGES/DRESSINGS) ×6 IMPLANT
BRUSH SCRUB EZ PLAIN DRY (MISCELLANEOUS) ×6 IMPLANT
COVER MAYO STAND STRL (DRAPES) ×3 IMPLANT
COVER SURGICAL LIGHT HANDLE (MISCELLANEOUS) ×3 IMPLANT
COVER WAND RF STERILE (DRAPES) ×3 IMPLANT
CUFF TOURN SGL QUICK 34 (TOURNIQUET CUFF) ×2
CUFF TRNQT CYL 34X4.125X (TOURNIQUET CUFF) ×1 IMPLANT
DRAPE C-ARM 42X72 X-RAY (DRAPES) ×3 IMPLANT
DRAPE C-ARMOR (DRAPES) IMPLANT
DRAPE HALF SHEET 40X57 (DRAPES) ×3 IMPLANT
DRAPE U-SHAPE 47X51 STRL (DRAPES) ×3 IMPLANT
DRILL SOLID 2.0 X 110MM (DRILL) ×3
DRILL SURGICAL 2.7 TUFFNEK (BIT) ×3
DRSG ADAPTIC 3X8 NADH LF (GAUZE/BANDAGES/DRESSINGS) ×3 IMPLANT
DRSG EMULSION OIL 3X3 NADH (GAUZE/BANDAGES/DRESSINGS) ×3 IMPLANT
ELECT REM PT RETURN 9FT ADLT (ELECTROSURGICAL) ×3
ELECTRODE REM PT RTRN 9FT ADLT (ELECTROSURGICAL) ×1 IMPLANT
GAUZE SPONGE 4X4 12PLY STRL (GAUZE/BANDAGES/DRESSINGS) IMPLANT
GAUZE SPONGE 4X4 12PLY STRL LF (GAUZE/BANDAGES/DRESSINGS) ×3 IMPLANT
GLOVE BIO SURGEON STRL SZ7.5 (GLOVE) ×3 IMPLANT
GLOVE BIO SURGEON STRL SZ8 (GLOVE) ×3 IMPLANT
GLOVE BIOGEL PI IND STRL 7.5 (GLOVE) ×2 IMPLANT
GLOVE BIOGEL PI IND STRL 8 (GLOVE) ×1 IMPLANT
GLOVE BIOGEL PI INDICATOR 7.5 (GLOVE) ×4
GLOVE BIOGEL PI INDICATOR 8 (GLOVE) ×2
GLOVE SURG SS PI 7.5 STRL IVOR (GLOVE) ×3 IMPLANT
GOWN STRL REUS W/ TWL LRG LVL3 (GOWN DISPOSABLE) ×2 IMPLANT
GOWN STRL REUS W/ TWL XL LVL3 (GOWN DISPOSABLE) ×1 IMPLANT
GOWN STRL REUS W/TWL 2XL LVL3 (GOWN DISPOSABLE) ×3 IMPLANT
GOWN STRL REUS W/TWL LRG LVL3 (GOWN DISPOSABLE) ×4
GOWN STRL REUS W/TWL XL LVL3 (GOWN DISPOSABLE) ×2
K-WIRE SURGICAL 1.6X102 (WIRE) ×9 IMPLANT
KIT BASIN OR (CUSTOM PROCEDURE TRAY) ×3 IMPLANT
KIT TURNOVER KIT B (KITS) ×3 IMPLANT
MANIFOLD NEPTUNE II (INSTRUMENTS) IMPLANT
NEEDLE HYPO 21X1.5 SAFETY (NEEDLE) IMPLANT
NS IRRIG 1000ML POUR BTL (IV SOLUTION) ×3 IMPLANT
PACK GENERAL/GYN (CUSTOM PROCEDURE TRAY) IMPLANT
PACK ORTHO EXTREMITY (CUSTOM PROCEDURE TRAY) ×3 IMPLANT
PAD ABD 8X10 STRL (GAUZE/BANDAGES/DRESSINGS) ×3 IMPLANT
PAD ARMBOARD 7.5X6 YLW CONV (MISCELLANEOUS) ×6 IMPLANT
PAD CAST 4YDX4 CTTN HI CHSV (CAST SUPPLIES) ×1 IMPLANT
PADDING CAST COTTON 4X4 STRL (CAST SUPPLIES) ×2
PADDING CAST COTTON 6X4 STRL (CAST SUPPLIES) ×3 IMPLANT
PLATE DIST TIB 7H RT (Plate) ×3 IMPLANT
PLATE FIB 11H ANATOMICAL RT (Plate) ×3 IMPLANT
PLATE FIBULAR STRT 5H (Plate) ×3 IMPLANT
SCREW 2.7 X 15 NON LOCK (Screw) ×3 IMPLANT
SCREW 3.5X26 NONLOCKING (Screw) ×3 IMPLANT
SCREW LOCK PLATE R3 2.7X12 (Screw) ×3 IMPLANT
SCREW LOCK PLATE R3 2.7X14 (Screw) ×3 IMPLANT
SCREW LOCK PLATE R3 2.7X16 ×6 IMPLANT
SCREW LOCK PLATE R3 2.7X28 (Screw) ×3 IMPLANT
SCREW LOCK PLATE R3 3.5X36 (Screw) ×2 IMPLANT
SCREW NL 12X2.7XNS GORILLA R (Spacer) ×2 IMPLANT
SCREW NL 2.7X12 (Spacer) ×4 IMPLANT
SCREW NLOCK PLATE RECON 3.5X10 (Screw) ×3 IMPLANT
SCREW NLOCK R3CON PLATE 4.2X55 (Screw) ×3 IMPLANT
SCREW NON LOCK PLATE 2.7X16M (Screw) ×3 IMPLANT
SCREW NON LOCKING 2.7X20 (Screw) ×3 IMPLANT
SCREW NON LOCKING 3.5X12 (Screw) ×3 IMPLANT
SCREW NON LOCKING PLATE 2.7X14 (Screw) ×3 IMPLANT
SCREW NONLOCK PLATE R3 4.2X60 (Screw) ×6 IMPLANT
SCREW PLT 36X3.5X NONLOCK (Screw) ×1 IMPLANT
SCREW R3CON N/L PLATE 3.5X44 (Screw) ×6 IMPLANT
SPONGE LAP 18X18 RF (DISPOSABLE) ×3 IMPLANT
STAPLER VISISTAT 35W (STAPLE) IMPLANT
SUCTION FRAZIER HANDLE 10FR (MISCELLANEOUS) ×2
SUCTION TUBE FRAZIER 10FR DISP (MISCELLANEOUS) ×1 IMPLANT
SUT ETHILON 2 0 FS 18 (SUTURE) ×6 IMPLANT
SUT ETHILON 2 0 PSLX (SUTURE) ×3 IMPLANT
SUT ETHILON 3 0 PS 1 (SUTURE) IMPLANT
SUT PDS AB 2-0 CT1 27 (SUTURE) IMPLANT
SUT PROLENE 5 0 P 3 (SUTURE) ×3 IMPLANT
SUT VIC AB 2-0 CT1 27 (SUTURE) ×6
SUT VIC AB 2-0 CT1 TAPERPNT 27 (SUTURE) ×3 IMPLANT
TOWEL GREEN STERILE (TOWEL DISPOSABLE) ×6 IMPLANT
TOWEL GREEN STERILE FF (TOWEL DISPOSABLE) ×3 IMPLANT
TUBE CONNECTING 12'X1/4 (SUCTIONS) ×1
TUBE CONNECTING 12X1/4 (SUCTIONS) ×2 IMPLANT
UNDERPAD 30X30 (UNDERPADS AND DIAPERS) ×3 IMPLANT
WATER STERILE IRR 1000ML POUR (IV SOLUTION) ×3 IMPLANT
WIRE OLIVE SMOOTH 1.4MMX60MM (WIRE) ×3 IMPLANT

## 2020-01-08 NOTE — Anesthesia Postprocedure Evaluation (Signed)
Anesthesia Post Note  Patient: Gene Gonzalez  Procedure(s) Performed: OPEN REDUCTION INTERNAL FIXATION (ORIF) ANKLE FRACTURE (Right Ankle)     Patient location during evaluation: PACU Anesthesia Type: Regional and General Level of consciousness: awake Pain management: pain level controlled Vital Signs Assessment: post-procedure vital signs reviewed and stable Respiratory status: spontaneous breathing Cardiovascular status: stable Postop Assessment: no apparent nausea or vomiting Anesthetic complications: no    Last Vitals:  Vitals:   01/08/20 2200 01/08/20 2215  BP: 93/60 103/69  Pulse: 70 74  Resp: 10 11  Temp:    SpO2: 94% 96%    Last Pain:  Vitals:   01/08/20 2215  TempSrc:   PainSc: 0-No pain                 Keighan Amezcua

## 2020-01-08 NOTE — Transfer of Care (Signed)
Immediate Anesthesia Transfer of Care Note  Patient: Gene Gonzalez  Procedure(s) Performed: OPEN REDUCTION INTERNAL FIXATION (ORIF) ANKLE FRACTURE (Right Ankle)  Patient Location: PACU  Anesthesia Type:General and regional  Level of Consciousness: awake and patient cooperative  Airway & Oxygen Therapy: Patient Spontanous Breathing and Patient connected to face mask oxygen  Post-op Assessment: Report given to RN and Post -op Vital signs reviewed and stable  Post vital signs: Reviewed and stable  Last Vitals:  Vitals Value Taken Time  BP 98/59 01/08/20 2128  Temp    Pulse 73 01/08/20 2131  Resp 14 01/08/20 2131  SpO2 95 % 01/08/20 2131  Vitals shown include unvalidated device data.  Last Pain:  Vitals:   01/08/20 1510  TempSrc:   PainSc: 0-No pain         Complications: No apparent anesthesia complications

## 2020-01-08 NOTE — Anesthesia Procedure Notes (Addendum)
Anesthesia Regional Block: Adductor canal block   Pre-Anesthetic Checklist: ,, timeout performed, Correct Patient, Correct Site, Correct Laterality, Correct Procedure, Correct Position, site marked, Risks and benefits discussed,  Surgical consent,  Pre-op evaluation,  At surgeon's request and post-op pain management  Laterality: Right  Prep: chloraprep       Needles:  Injection technique: Single-shot  Needle Type: Echogenic Stimulator Needle     Needle Length: 9cm  Needle Gauge: 21     Additional Needles:   Procedures:,,,, ultrasound used (permanent image in chart),,,,  Narrative:  Start time: 01/08/2020 3:15 PM End time: 01/08/2020 3:20 PM Injection made incrementally with aspirations every 5 mL.  Performed by: Personally  Anesthesiologist: Shelton Silvas, MD  Additional Notes: Patient tolerated the procedure well. Local anesthetic introduced in an incremental fashion under minimal resistance after negative aspirations. No paresthesias were elicited. After completion of the procedure, no acute issues were identified and patient continued to be monitored by RN.

## 2020-01-08 NOTE — Progress Notes (Signed)
Xrays completed as ordered. 

## 2020-01-08 NOTE — Anesthesia Preprocedure Evaluation (Addendum)
Anesthesia Evaluation  Patient identified by MRN, date of birth, ID band Patient awake    Reviewed: Allergy & Precautions, NPO status , Patient's Chart, lab work & pertinent test results  History of Anesthesia Complications (+) DIFFICULT AIRWAY  Airway Mallampati: III  TM Distance: >3 FB Neck ROM: Full    Dental  (+) Teeth Intact, Dental Advisory Given   Pulmonary Current Smoker and Patient abstained from smoking.,    breath sounds clear to auscultation       Cardiovascular negative cardio ROS   Rhythm:Regular Rate:Normal     Neuro/Psych PSYCHIATRIC DISORDERS Depression negative neurological ROS     GI/Hepatic negative GI ROS, Neg liver ROS,   Endo/Other  negative endocrine ROS  Renal/GU negative Renal ROS     Musculoskeletal negative musculoskeletal ROS (+)   Abdominal (+) + obese,   Peds  Hematology negative hematology ROS (+)   Anesthesia Other Findings   Reproductive/Obstetrics                           Anesthesia Physical Anesthesia Plan  ASA: II  Anesthesia Plan: General   Post-op Pain Management: GA combined w/ Regional for post-op pain   Induction: Intravenous  PONV Risk Score and Plan: 2 and Ondansetron and Midazolam  Airway Management Planned: LMA  Additional Equipment: None  Intra-op Plan:   Post-operative Plan: Extubation in OR  Informed Consent: I have reviewed the patients History and Physical, chart, labs and discussed the procedure including the risks, benefits and alternatives for the proposed anesthesia with the patient or authorized representative who has indicated his/her understanding and acceptance.     Dental advisory given  Plan Discussed with: CRNA  Anesthesia Plan Comments:        Anesthesia Quick Evaluation

## 2020-01-08 NOTE — Anesthesia Procedure Notes (Signed)
Procedure Name: Intubation Date/Time: 01/08/2020 5:42 PM Performed by: Sheppard Evens, CRNA Pre-anesthesia Checklist: Patient identified, Suction available and Patient being monitored Patient Re-evaluated:Patient Re-evaluated prior to induction Oxygen Delivery Method: Circle system utilized Preoxygenation: Pre-oxygenation with 100% oxygen Induction Type: IV induction Ventilation: Two handed mask ventilation required Laryngoscope Size: Miller and 2 Grade View: Grade II Tube type: Oral Tube size: 7.5 mm Number of attempts: 2 Airway Equipment and Method: Patient positioned with wedge pillow and Stylet Placement Confirmation: ETT inserted through vocal cords under direct vision,  positive ETCO2 and breath sounds checked- equal and bilateral Secured at: 23 cm Tube secured with: Tape Dental Injury: Teeth and Oropharynx as per pre-operative assessment  Comments: DL x1 - [place]xton CRNA; intubation by E. Sampson Goon MD

## 2020-01-08 NOTE — Anesthesia Procedure Notes (Addendum)
Anesthesia Regional Block: Popliteal block   Pre-Anesthetic Checklist: ,, timeout performed, Correct Patient, Correct Site, Correct Laterality, Correct Procedure, Correct Position, site marked, Risks and benefits discussed,  Surgical consent,  Pre-op evaluation,  At surgeon's request and post-op pain management  Laterality: Right  Prep: chloraprep       Needles:  Injection technique: Single-shot  Needle Type: Echogenic Stimulator Needle     Needle Length: 9cm  Needle Gauge: 21     Additional Needles:   Procedures:,,,, ultrasound used (permanent image in chart),,,,  Narrative:  Start time: 01/08/2020 3:10 PM End time: 01/08/2020 3:15 PM Injection made incrementally with aspirations every 5 mL.  Performed by: Personally  Anesthesiologist: Shelton Silvas, MD  Additional Notes: Patient tolerated the procedure well. Local anesthetic introduced in an incremental fashion under minimal resistance after negative aspirations. No paresthesias were elicited. After completion of the procedure, no acute issues were identified and patient continued to be monitored by RN.

## 2020-01-08 NOTE — Brief Op Note (Signed)
01/08/2020  9:38 PM  PATIENT:  Gene Gonzalez  54 y.o. male  Dictated: [phone]

## 2020-01-08 NOTE — Progress Notes (Signed)
Patient aware that he needs to arrive to the hospital at 1200.  Patient states that he has been NPO.

## 2020-01-09 ENCOUNTER — Encounter (HOSPITAL_COMMUNITY): Payer: Self-pay | Admitting: Orthopedic Surgery

## 2020-01-09 DIAGNOSIS — F329 Major depressive disorder, single episode, unspecified: Secondary | ICD-10-CM | POA: Diagnosis present

## 2020-01-09 DIAGNOSIS — S9304XA Dislocation of right ankle joint, initial encounter: Secondary | ICD-10-CM

## 2020-01-09 DIAGNOSIS — F1120 Opioid dependence, uncomplicated: Secondary | ICD-10-CM | POA: Diagnosis present

## 2020-01-09 DIAGNOSIS — S93431A Sprain of tibiofibular ligament of right ankle, initial encounter: Secondary | ICD-10-CM

## 2020-01-09 DIAGNOSIS — F32A Depression, unspecified: Secondary | ICD-10-CM | POA: Diagnosis present

## 2020-01-09 DIAGNOSIS — E559 Vitamin D deficiency, unspecified: Secondary | ICD-10-CM

## 2020-01-09 DIAGNOSIS — F1111 Opioid abuse, in remission: Secondary | ICD-10-CM | POA: Diagnosis present

## 2020-01-09 HISTORY — DX: Vitamin D deficiency, unspecified: E55.9

## 2020-01-09 HISTORY — DX: Dislocation of right ankle joint, initial encounter: S93.04XA

## 2020-01-09 HISTORY — DX: Sprain of tibiofibular ligament of right ankle, initial encounter: S93.431A

## 2020-01-09 LAB — COMPREHENSIVE METABOLIC PANEL
ALT: 42 U/L (ref 0–44)
AST: 56 U/L — ABNORMAL HIGH (ref 15–41)
Albumin: 2.8 g/dL — ABNORMAL LOW (ref 3.5–5.0)
Alkaline Phosphatase: 73 U/L (ref 38–126)
Anion gap: 7 (ref 5–15)
BUN: 8 mg/dL (ref 6–20)
CO2: 25 mmol/L (ref 22–32)
Calcium: 8.5 mg/dL — ABNORMAL LOW (ref 8.9–10.3)
Chloride: 103 mmol/L (ref 98–111)
Creatinine, Ser: 0.93 mg/dL (ref 0.61–1.24)
GFR calc Af Amer: >60 mL/min (ref >60)
GFR calc non Af Amer: >60 mL/min (ref >60)
Glucose, Bld: 237 mg/dL — ABNORMAL HIGH (ref 70–99)
Potassium: 4.7 mmol/L (ref 3.5–5.1)
Sodium: 135 mmol/L (ref 135–145)
Total Bilirubin: 0.5 mg/dL (ref 0.3–1.2)
Total Protein: 6.5 g/dL (ref 6.5–8.1)

## 2020-01-09 LAB — CBC
HCT: 42 % (ref 39.0–52.0)
Hemoglobin: 13.5 g/dL (ref 13.0–17.0)
MCH: 29.4 pg (ref 26.0–34.0)
MCHC: 32.1 g/dL (ref 30.0–36.0)
MCV: 91.5 fL (ref 80.0–100.0)
Platelets: 203 10*3/uL (ref 150–400)
RBC: 4.59 MIL/uL (ref 4.22–5.81)
RDW: 12.9 % (ref 11.5–15.5)
WBC: 9.4 10*3/uL (ref 4.0–10.5)
nRBC: 0 % (ref 0.0–0.2)

## 2020-01-09 LAB — VITAMIN D 25 HYDROXY (VIT D DEFICIENCY, FRACTURES): Vit D, 25-Hydroxy: 19.68 ng/mL — ABNORMAL LOW (ref 30–100)

## 2020-01-09 MED ORDER — ASCORBIC ACID 500 MG PO TABS: 500.0000 mg | ORAL_TABLET | Freq: Every day | ORAL | 0 refills | Status: AC

## 2020-01-09 MED ORDER — ASPIRIN 325 MG PO TBEC: 325.0000 mg | DELAYED_RELEASE_TABLET | Freq: Two times a day (BID) | ORAL | 0 refills | Status: AC

## 2020-01-09 MED ORDER — ASPIRIN EC 325 MG PO TBEC
325.0000 mg | DELAYED_RELEASE_TABLET | Freq: Two times a day (BID) | ORAL | Status: DC
  Administered 2020-01-09: 325 mg via ORAL
  Filled 2020-01-09 (×2): qty 1

## 2020-01-09 MED ORDER — VITAMIN D 25 MCG (1000 UNIT) PO TABS: 2000.0000 [IU] | ORAL_TABLET | Freq: Two times a day (BID) | ORAL | Status: DC

## 2020-01-09 MED ORDER — ACETAMINOPHEN 500 MG PO TABS: 500.0000 mg | ORAL_TABLET | Freq: Two times a day (BID) | ORAL | 0 refills | Status: DC

## 2020-01-09 MED ORDER — METHOCARBAMOL 500 MG PO TABS: 500.0000 mg | ORAL_TABLET | Freq: Three times a day (TID) | ORAL | 0 refills | Status: AC | PRN

## 2020-01-09 MED ORDER — ASCORBIC ACID 500 MG PO TABS: 500.0000 mg | ORAL_TABLET | Freq: Every day | ORAL | Status: DC

## 2020-01-09 MED ORDER — HYDROCODONE-ACETAMINOPHEN 5-325 MG PO TABS: 1.0000 | ORAL_TABLET | Freq: Four times a day (QID) | ORAL | 0 refills | Status: DC | PRN

## 2020-01-09 MED ORDER — TRAMADOL HCL 50 MG PO TABS: 50.0000 mg | ORAL_TABLET | Freq: Four times a day (QID) | ORAL | 0 refills | Status: DC | PRN

## 2020-01-09 MED ORDER — VITAMIN D3 25 MCG PO TABS: 2000.0000 [IU] | ORAL_TABLET | Freq: Two times a day (BID) | ORAL | 3 refills | Status: AC

## 2020-01-09 MED FILL — VITAMIN D3 25 MCG TABS: 25 | 30 days supply | Qty: 120 | Fill #0

## 2020-01-09 MED FILL — METHOCARBAMOL 500 MG TABS: 500 | 10 days supply | Qty: 60 | Fill #0

## 2020-01-09 MED FILL — HYDROCODON-APAP 5-325: 5-325 | 4 days supply | Qty: 30 | Fill #0

## 2020-01-09 MED FILL — VITAMIN C 500 MG TABLET: 500 | 30 days supply | Qty: 30 | Fill #0

## 2020-01-09 MED FILL — ACETAMINOPHEN 500MG XT STRE: 500 | 30 days supply | Qty: 60 | Fill #0

## 2020-01-09 MED FILL — ASPIRIN EC 325 MG TABLET: 325 | 30 days supply | Qty: 60 | Fill #0

## 2020-01-09 MED FILL — traMADol HCL 50 MG TABS: 50 | 7 days supply | Qty: 28 | Fill #0

## 2020-01-09 NOTE — Discharge Instructions (Signed)
Orthopaedic Trauma Service Discharge Instructions   General Discharge Instructions  Orthopaedic Injuries:  Right trimalleolar ankle fracture dislocation treated with open reduction internal fixation using plates and screws  WEIGHT BEARING STATUS: Nonweightbearing right leg using walker or knee scooter  RANGE OF MOTION/ACTIVITY: Okay to move toes and knee.  Do not remove splint.  Activity as tolerated while maintaining weightbearing restrictions.  Keep right leg elevated as much as possible to help with swelling and pain control  Bone health: Labs do show a little bit of vitamin D deficiency.  Continue to take daily vitamin D supplementation.  Wound Care: Keep splint clean and dry.  Do not remove for any reason.  Please call the office if your splint gets to light.  We will remove splint at your first postoperative visit  DVT/PE prophylaxis: Aspirin 325 mg twice a day x 4 weeks  Diet: as you were eating previously.  Can use over the counter stool softeners and bowel preparations, such as Miralax, to help with bowel movements.  Narcotics can be constipating.  Be sure to drink plenty of fluids  PAIN MEDICATION USE AND EXPECTATIONS  You have likely been given narcotic medications to help control your pain.  After a traumatic event that results in an fracture (broken bone) with or without surgery, it is ok to use narcotic pain medications to help control one's pain.  We understand that everyone responds to pain differently and each individual patient will be evaluated on a regular basis for the continued need for narcotic medications. Ideally, narcotic medication use should last no more than 6-8 weeks (coinciding with fracture healing).   As a patient it is your responsibility as well to monitor narcotic medication use and report the amount and frequency you use these medications when you come to your office visit.   We would also advise that if you are using narcotic medications, you should  take a dose prior to therapy to maximize you participation.  IF YOU ARE ON NARCOTIC MEDICATIONS IT IS NOT PERMISSIBLE TO OPERATE A MOTOR VEHICLE (MOTORCYCLE/CAR/TRUCK/MOPED) OR HEAVY MACHINERY DO NOT MIX NARCOTICS WITH OTHER CNS (CENTRAL NERVOUS SYSTEM) DEPRESSANTS SUCH AS ALCOHOL   STOP SMOKING OR USING NICOTINE PRODUCTS!!!!  As discussed nicotine severely impairs your body's ability to heal surgical and traumatic wounds but also impairs bone healing.  Wounds and bone heal by forming microscopic blood vessels (angiogenesis) and nicotine is a vasoconstrictor (essentially, shrinks blood vessels).  Therefore, if vasoconstriction occurs to these microscopic blood vessels they essentially disappear and are unable to deliver necessary nutrients to the healing tissue.  This is one modifiable factor that you can do to dramatically increase your chances of healing your injury.    (This means no smoking, no nicotine gum, patches, etc)  DO NOT USE NONSTEROIDAL ANTI-INFLAMMATORY DRUGS (NSAID'S)  Using products such as Advil (ibuprofen), Aleve (naproxen), Motrin (ibuprofen) for additional pain control during fracture healing can delay and/or prevent the healing response.  If you would like to take over the counter (OTC) medication, Tylenol (acetaminophen) is ok.  However, some narcotic medications that are given for pain control contain acetaminophen as well. Therefore, you should not exceed more than 4000 mg of tylenol in a day if you do not have liver disease.  Also note that there are may OTC medicines, such as cold medicines and allergy medicines that my contain tylenol as well.  If you have any questions about medications and/or interactions please ask your doctor/PA or your pharmacist.  ICE AND ELEVATE INJURED/OPERATIVE EXTREMITY  Using ice and elevating the injured extremity above your heart can help with swelling and pain control.  Icing in a pulsatile fashion, such as 20 minutes on and 20 minutes  off, can be followed.    Do not place ice directly on skin. Make sure there is a barrier between to skin and the ice pack.    Using frozen items such as frozen peas works well as the conform nicely to the are that needs to be iced.  USE AN ACE WRAP OR TED HOSE FOR SWELLING CONTROL  In addition to icing and elevation, Ace wraps or TED hose are used to help limit and resolve swelling.  It is recommended to use Ace wraps or TED hose until you are informed to stop.    When using Ace Wraps start the wrapping distally (farthest away from the body) and wrap proximally (closer to the body)   Example: If you had surgery on your leg or thing and you do not have a splint on, start the ace wrap at the toes and work your way up to the thigh        If you had surgery on your upper extremity and do not have a splint on, start the ace wrap at your fingers and work your way up to the upper arm  IF YOU ARE IN A SPLINT OR CAST DO NOT REMOVE IT FOR ANY REASON   If your splint gets wet for any reason please contact the office immediately. You may shower in your splint or cast as long as you keep it dry.  This can be done by wrapping in a cast cover or garbage back (or similar)  Do Not stick any thing down your splint or cast such as pencils, money, or hangers to try and scratch yourself with.  If you feel itchy take benadryl as prescribed on the bottle for itching  IF YOU ARE IN A CAM BOOT (BLACK BOOT)  You may remove boot periodically. Perform daily dressing changes as noted below.  Wash the liner of the boot regularly and wear a sock when wearing the boot. It is recommended that you sleep in the boot until told otherwise    Call office for the following:  Temperature greater than 101F  Persistent nausea and vomiting  Severe uncontrolled pain  Redness, tenderness, or signs of infection (pain, swelling, redness, odor or green/yellow discharge around the site)  Difficulty breathing, headache or visual  disturbances  Hives  Persistent dizziness or light-headedness  Extreme fatigue  Any other questions or concerns you may have after discharge  In an emergency, call 911 or go to an Emergency Department at a nearby hospital    CALL THE OFFICE WITH ANY QUESTIONS OR CONCERNS: [phone]   VISIT OUR WEBSITE FOR ADDITIONAL INFORMATION: [email or web address]

## 2020-01-09 NOTE — Evaluation (Signed)
Occupational Therapy Evaluation Patient Details Name: Gene Gonzalez MRN: [id number] DOB: Dec 12, 1965 Today's Date: 01/09/2020    History of Present Illness 54 y.o male presents s/p R ankle ORIF after a fall in the kitchen. No significant PMH on file.   Clinical Impression   PTA pt independent, has been managing NWB on R ankle since his fall a few weeks ago. Pt demonstrated ability to complete transfers and mobility at mod I level with appropriate precautions.  Pt then demonstrated donning his own clothing and toilet transfer at mod I assist. Pt has all needed DME. No further OT needs identified, OT will sign off. Thank you for this consult.    Follow Up Recommendations  No OT follow up    Equipment Recommendations  None recommended by OT    Recommendations for Other Services       Precautions / Restrictions Precautions Precautions: Fall Required Braces or Orthoses: Other Brace(R ankle splint) Restrictions Weight Bearing Restrictions: Yes RLE Weight Bearing: Non weight bearing      Mobility Bed Mobility Overal bed mobility: Modified Independent                Transfers Overall transfer level: Modified independent Equipment used: Rolling walker (2 wheeled)                  Balance Overall balance assessment: Mild deficits observed, not formally tested                                         ADL either performed or assessed with clinical judgement   ADL Overall ADL's : Modified independent                                       General ADL Comments: Pt is at mod I level for BADL. Pt demonstrated LB dressing, toilet transfer, and functional mobility withought external assist. Educated on use of shower seat and safe bathing, as well as wheel chair for functional mobility as needed.     Vision Patient Visual Report: No change from baseline       Perception     Praxis      Pertinent Vitals/Pain Pain Assessment: No/denies  pain(still numb with nerve block)     Hand Dominance     Extremity/Trunk Assessment Upper Extremity Assessment Upper Extremity Assessment: Overall WFL for tasks assessed   Lower Extremity Assessment Lower Extremity Assessment: Defer to PT evaluation;RLE deficits/detail RLE Deficits / Details: splinted, numb       Communication Communication Communication: No difficulties   Cognition Arousal/Alertness: Awake/alert Behavior During Therapy: WFL for tasks assessed/performed Overall Cognitive Status: Within Functional Limits for tasks assessed                                     General Comments       Exercises     Shoulder Instructions      Home Living Family/patient expects to be discharged to:: Private residence Living Arrangements: Alone;Non-relatives/Friends;Other (Comment)(satying with friends at d/c) Available Help at Discharge: Friend(s);Available 24 hours/day(they [facility]) Type of Home: House Home Access: Ramped entrance     Home Layout: Two level;Able to live on main level with bedroom/bathroom Alternate Level Stairs-Number of Steps: does  not need to go upstairs   Bathroom Shower/Tub: Producer, television/film/video: Standard     Home Equipment: Environmental consultant - 2 wheels;Wheelchair - manual          Prior Functioning/Environment Level of Independence: Independent                 OT Problem List: Decreased knowledge of use of DME or AE;Decreased knowledge of precautions;Impaired balance (sitting and/or standing);Decreased safety awareness      OT Treatment/Interventions:      OT Goals(Current goals can be found in the care plan section) Acute Rehab OT Goals Patient Stated Goal: return to independence OT Goal Formulation: All assessment and education complete, DC therapy  OT Frequency:     Barriers to D/C:            Co-evaluation PT/OT/SLP Co-Evaluation/Treatment: Yes Reason for Co-Treatment: For patient/therapist safety   OT  goals addressed during session: ADL's and self-care      AM-PAC OT "6 Clicks" Daily Activity     Outcome Measure Help from another person eating meals?: None Help from another person taking care of personal grooming?: None Help from another person toileting, which includes using toliet, bedpan, or urinal?: None Help from another person bathing (including washing, rinsing, drying)?: None Help from another person to put on and taking off regular upper body clothing?: None Help from another person to put on and taking off regular lower body clothing?: None 6 Click Score: 24   End of Session Equipment Utilized During Treatment: Gait belt;Rolling walker Nurse Communication: Mobility status;Precautions;Weight bearing status  Activity Tolerance: Patient tolerated treatment well Patient left: in bed;with call bell/phone within reach  OT Visit Diagnosis: Other abnormalities of gait and mobility (R26.89)                Time: 0388-8280 OT Time Calculation (min): 15 min Charges:  OT General Charges $OT Visit: 1 Visit OT Evaluation $OT Eval Low Complexity: 1 Low  Dalphine Handing, MSOT, OTR/L Acute Rehabilitation Services [facility] Office Number: [phone] Pager: [phone]  Dalphine Handing 01/09/2020, 8:57 AM

## 2020-01-09 NOTE — Progress Notes (Addendum)
[facility]edic Trauma Service Progress Note  Patient ID: Gene Gonzalez MRN: [id number] DOB/AGE: 01/31/1966 54 y.o.  Subjective:  Doing well Block still working  Ready to go home Has already worked with therapy   On methadone x 10 years Previous heroin abuser   Very appreciative of care   No CP No SOB No N/v No abd pain   ROS As above  Objective:   VITALS:   Vitals:   01/08/20 2230 01/08/20 2254 01/09/20 0358 01/09/20 0802  BP: 110/69 111/69 113/66 (!) 102/56  Pulse: 77 73 81 78  Resp: 10 20 18    Temp: (!) 97.4 F (36.3 C) 98.6 F (37 C) 98.6 F (37 C) 98.5 F (36.9 C)  TempSrc:  Oral Oral Oral  SpO2: 94% 97% 94% 92%  Weight:      Height:        Estimated body mass index is 35.44 kg/m as calculated from the following:   Height as of this encounter: 5\' 9"  (1.753 m).   Weight as of this encounter: 108.9 kg.   Intake/Output      05/06 0701 - 05/07 0700 05/07 0701 - 05/08 0700   I.V. (mL/kg) 2400 (22)    IV Piggyback 50    Total Intake(mL/kg) 2450 (22.5)    Urine (mL/kg/hr) 700 550 (1.3)   Blood 150    Total Output 850 550   Net +1600 -550          LABS  Results for orders placed or performed during the hospital encounter of 01/08/20 (from the past 24 hour(s))  Respiratory Panel by RT PCR (Flu A&B, Covid) - Nasopharyngeal Swab     Status: None   Collection Time: 01/08/20 12:48 PM   Specimen: Nasopharyngeal Swab  Result Value Ref Range   SARS Coronavirus 2 by RT PCR NEGATIVE NEGATIVE   Influenza A by PCR NEGATIVE NEGATIVE   Influenza B by PCR NEGATIVE NEGATIVE  CBC     Status: None   Collection Time: 01/08/20 11:06 PM  Result Value Ref Range   WBC 7.6 4.0 - 10.5 K/uL   RBC 4.82 4.22 - 5.81 MIL/uL   Hemoglobin 14.4 13.0 - 17.0 g/dL   HCT 03/09/20 03/09/20 - 48.1 %   MCV 92.7 80.0 - 100.0 fL   MCH 29.9 26.0 - 34.0 pg   MCHC 32.2 30.0 - 36.0 g/dL   RDW 85.6 31.4 - 97.0 %   Platelets  201 150 - 400 K/uL   nRBC 0.0 0.0 - 0.2 %  Creatinine, serum     Status: None   Collection Time: 01/08/20 11:06 PM  Result Value Ref Range   Creatinine, Ser 1.05 0.61 - 1.24 mg/dL   GFR calc non Af Amer >60 >60 mL/min   GFR calc Af Amer >60 >60 mL/min  VITAMIN D 25 Hydroxy (Vit-D Deficiency, Fractures)     Status: Abnormal   Collection Time: 01/09/20  5:02 AM  Result Value Ref Range   Vit D, 25-Hydroxy 19.68 (L) 30 - 100 ng/mL  Comprehensive metabolic panel     Status: Abnormal   Collection Time: 01/09/20  5:02 AM  Result Value Ref Range   Sodium 135 135 - 145 mmol/L   Potassium 4.7 3.5 - 5.1 mmol/L   Chloride 103 98 - 111 mmol/L   CO2  25 22 - 32 mmol/L   Glucose, Bld 237 (H) 70 - 99 mg/dL   BUN 8 6 - 20 mg/dL   Creatinine, Ser 6.72 0.61 - 1.24 mg/dL   Calcium 8.5 (L) 8.9 - 10.3 mg/dL   Total Protein 6.5 6.5 - 8.1 g/dL   Albumin 2.8 (L) 3.5 - 5.0 g/dL   AST 56 (H) 15 - 41 U/L   ALT 42 0 - 44 U/L   Alkaline Phosphatase 73 38 - 126 U/L   Total Bilirubin 0.5 0.3 - 1.2 mg/dL   GFR calc non Af Amer >60 >60 mL/min   GFR calc Af Amer >60 >60 mL/min   Anion gap 7 5 - 15  CBC     Status: None   Collection Time: 01/09/20  5:02 AM  Result Value Ref Range   WBC 9.4 4.0 - 10.5 K/uL   RBC 4.59 4.22 - 5.81 MIL/uL   Hemoglobin 13.5 13.0 - 17.0 g/dL   HCT 09.4 70.9 - 62.8 %   MCV 91.5 80.0 - 100.0 fL   MCH 29.4 26.0 - 34.0 pg   MCHC 32.1 30.0 - 36.0 g/dL   RDW 36.6 29.4 - 76.5 %   Platelets 203 150 - 400 K/uL   nRBC 0.0 0.0 - 0.2 %     PHYSICAL EXAM:   Gen: sitting up in bed, dressed in regular street clothes, NAD, pleasant  Lungs: unlabored Cardiac: regular  Ext:       Right Lower Extremity   Splint c/d/i  Splint fitting well  Ext warm   + DP Pulse  No pain out of proportion with passive stretching   No motor or sensory function noted due to block   Swelling well controlled     Assessment/Plan: 1 Day Post-Op   Principal Problem:   Closed displaced trimalleolar  fracture of right ankle Active Problems:   Vitamin D deficiency   Depression   History of heroin abuse (HCC)   Methadone maintenance therapy patient (HCC)   Anti-infectives (From admission, onward)   Start     Dose/Rate Route Frequency Ordered Stop   01/08/20 2359  ceFAZolin (ANCEF) IVPB 2g/100 mL premix     2 g 200 mL/hr over 30 Minutes Intravenous Every 6 hours 01/08/20 2247 01/09/20 1759    .  POD/HD#: 1  54 y/o male with complex R trimalleolar ankle fracture dislocation with syndesmotic disruption  -R trimalleolar ankle fracture dislocation with syndesmotic disruption s/p ORIF  Nonweightbearing x 8 weeks use walker or wheelchair to mobilize  Splint x2 weeks then convert to cast or cam boot.  We will not be able to initiate ankle range of motion until tibiotalar calcaneal pins are removed which will occur around the 4-8 week mark  Aggressive ice and elevation for swelling and pain control  Patient has been cleared by therapies will not require outpatient therapy until he is weightbearing possibly sooner if there are issues with range of motion   We will need to be aggressive with heel cord stretching.  Patient was very tight in terms of his heel cord in the OR   Patient is to keep his splint on until follow-up.  Does not need to remove.  Is to keep it clean and dry  - Pain management:  We did discuss his past substance abuse.  The other choice is heroin which is why he is on methadone.  Continue his methadone to avoid withdrawal symptoms  We will add low-dose Norco and  Ultram.  Quick titration off Norco and anticipate off of Ultram by 4 weeks postop.  Preferably sooner as possible  Scheduled tylenol 500 mg po q12h  - ABL anemia/Hemodynamics  Stable  - Medical issues   Chronic methadone   Continue methadone   Goes to clinic daily  - DVT/PE prophylaxis:  Aspirin 325 mg twice daily x 4 weeks  - ID:   Perioperative antibiotics completed  - Metabolic Bone Disease:  +  Vitamin D deficiency   Supplement   Meets criteria for fracture liaison consultation  - Activity:  Nonweightbearing right leg x 8 weeks  - FEN/GI prophylaxis/Foley/Lines:  Regular diet  Discontinue IV and IV fluids   - Impediments to fracture healing:  Chronic methadone use  Vitamin D deficiency  Clinically poor bone quality  - Dispo:  Discharge home today, follow-up with orthopedics in 10 to 14 days   Weightbearing: NWB RLE Insicional and dressing care: Dressings left intact until follow-up Orthopedic device(s): Walker and crutches Showering: Okay to shower as long as splint remains clean and dry.  They can cover in a bag or cast cover VTE prophylaxis: Aspirin 325mg  BID 4 weeks Pain control: Methadone, Ultram and Norco for breakthrough pain only, scheduled Tylenol 500 mg q12h  Follow - up plan: 2 weeks Contact information: Astrid Divine. Marcelino Scot MD, Jari Pigg PA-C  Jari Pigg, PA-C [phone] (C) 01/09/2020, 11:02 AM  [facility] [place] [address] [phone] Domingo Sep (F)

## 2020-01-09 NOTE — Op Note (Signed)
NAMEFELTON, BUCZYNSKI MEDICAL RECORD QI:[id number] ACCOUNT [id number] DATE OF BIRTH:02-Nov-1965 FACILITY: [facility] LOCATION: [place], [place]  OPERATIVE REPORT  DATE OF PROCEDURE:  01/08/2020  PREOPERATIVE DIAGNOSES: 1.  Right ankle trimalleolar fracture equivalent. 2.  Right ankle posterolateral dislocation.  POSTOPERATIVE DIAGNOSES:   1.  Right ankle trimalleolar fracture equivalent. 2.  Right ankle posterolateral dislocation. 3.  Ruptured syndesmosis, right ankle.  PROCEDURES: 1.  Open reduction internal fixation of right trimalleolar fracture equivalent including posterior buttress plating of the posterior malleolus. 2.  Open reduction of right ankle dislocation with skeletal fixation using two 2.0 mm pins. 3.  Stress evaluation of the right ankle syndesmosis. 4.  Open reduction internal fixation of right ankle syndesmosis with 2 quadricortical screws from the [employer] system.  SURGEON:  Altamese , MD  ASSISTANT:  Ainsley Spinner, PA-C  ANESTHESIA:  General.  COMPLICATIONS:  None.  ANESTHESIA:  General supplemented with regional block.  TOURNIQUET:  None.  DISPOSITION:  To PACU.  CONDITION:  Stable.  INDICATIONS FOR PROCEDURE:  The patient is a 54 year old male with a history of polysubstance abuse and chronic methadone treatment, who sustained a fracture dislocation that was irreducible and he did undergo 2 reduction attempts with sedation  anesthesia in the Emergency Department.  He presented to the office where soft tissue swelling precluded early treatment and he had to be followed until soft tissue swelling had resolved sufficiently to allow for definitive treatment.  I did discuss with  him the risks and benefits of surgical treatment including the possibility of failure to obtain an anatomic reduction, nerve injury, vessel injury, DVT, PE, arthritis, loss of motion, and need for further surgery including possible removal of  syndesmotic screws as well  as symptomatic hardware.  He did acknowledge these risks and wished to proceed.  BRIEF SUMMARY OF PROCEDURE:  The patient was taken to the operating room where general anesthesia was induced.  He was then positioned prone.  He did receive a preoperative regional block with Exparel.  Standard chlorhexidine wash Betadine scrub and  paint was performed and then timeout held after a standard drape.  We made a 15 cm incision not a fingerbreadth posterior to the posterior cortex of the fibula, carried dissection carefully down to the peroneal tendon sheath, which was released, allowing  [place] to mobilize the peroneal tendons posterior to access the fibula laterally or laterally to access the posterior aspect of the tibia.  Fracture site was identified of the fibula and this was developed with a 15 blade and a curette removing intervening  hematoma.  I was able to pull this down and obtained an anatomic reduction provisionally with a clamp.  I then continued the dissection with the help of my assistant onto the posterior aspect of the tibia.  Here, the posterior malleolus fracture was  identified and mobilized after cleaning with a curette and lavage.  I was unable to achieve a reduction because of the persistent dislocation of the ankle.  At this point, I then turned my attention medially where a 4 cm incision was made.  I was able to  access the joint.  I did ligate a branch of the saphenous vein and removed the debris from the medial aspect of the joint, irrigating it thoroughly.  I then was able to, with the help of my assistant, mobilize the ankle down and medial to obtain an  anatomic reduction.  This could not be held without a skeletal fixation, which was provided with two  2 mm pins.  It was clear as we stressed the ankle prior to this that the syndesmosis allowed for lateral translation of the talus.  That was in addition  to posterior translation.  The ball spike pusher was used to apply distal and anterior  force to the posterior malleolar segment, which was then pinned provisionally.  I turned my attention back to the fibula to begin instrumentation placing a posterior  buttress plate followed by a lateral buttress plate securing all proximal fixation initially and then a distal fixation.  Two syndesmotic screws were placed and these were quadricortical given that one of them was quite low and had a somewhat slightly  increased chance of fracture and could be withdrawn from the far side as well as to augment our fixation.  The patient's bone was weaker quality than would be anticipated for his age, but was consistent with our expectation given his overall medical  condition and social history.  We applied a posterior buttress plate and obtained excellent fixation with all screws including in the posterior lip where one was just in the subchondral surface and we were able to overdrill this on the near side for  compression across the articular surface.  Lastly, K-wires were bent off and then a thorough irrigation and a standard layer closure performed with application of a posterior and stirrup splint.  Montez Morita, PA-C, was present and assisting throughout  this extremely technically demanding case with operative time of approximately 3-1/2 hours.  PROGNOSIS:  The patient will need to be restricted weightbearing for the next 8 weeks with graduated weightbearing thereafter.  Anticipate removal of his syndesmotic screws at 4 months and removal of his pins at 4-8 weeks depending upon his progress.  He  will be on pharmacologic DVT prophylaxis if this is affordable for him.  However, it is seeming unlikely and aspirin will be our fall back in addition to active motion of the toes and early mobilization coupled with his elevation.  CN/NUANCE  D:01/08/2020 T:01/09/2020 JOB:[id number]

## 2020-01-09 NOTE — Progress Notes (Signed)
Orthopedic Tech Progress Note Patient Details:  Gene Gonzalez 04-14-1966 [id number]  Patient ID: Gene Gonzalez, male   DOB: 09-21-1965, 54 y.o.   MRN: [id number]  Overhead frame and trapeze have been applied to patients bed   Gene Gonzalez E Serra Younan 01/09/2020, 12:15 AM

## 2020-01-09 NOTE — Discharge Summary (Signed)
Orthopaedic Trauma Service (OTS) Discharge Summary   Patient ID: Gene Gonzalez MRN: [id number] DOB/AGE: 1965-11-02 54 y.o.  Admit date: 01/08/2020 Discharge date: 01/09/2020  Admission Diagnoses: Closed displaced right trimalleolar ankle fracture dislocation Depression History of heroin abuse Active methadone treatment  Discharge Diagnoses:  Principal Problem:   Closed displaced trimalleolar fracture of right ankle Active Problems:   Vitamin D deficiency   Depression   History of heroin abuse (HCC)   Methadone maintenance therapy patient (HCC)   Syndesmotic disruption of ankle, right, initial encounter   Closed dislocation of right ankle   Past Medical History:  Diagnosis Date  . Closed dislocation of right ankle 01/09/2020  . Depression   . Heart murmur   . History of heroin abuse (HCC)   . Methadone maintenance therapy patient (HCC)   . Syndesmotic disruption of ankle, right, initial encounter 01/09/2020  . Vitamin D deficiency 01/09/2020     Procedures Performed: 01/08/2020-Dr. Carola Frost 1.  Open reduction internal fixation of right trimalleolar fracture equivalent including posterior buttress plating of the posterior malleolus. 2.  Open reduction of right ankle dislocation with skeletal fixation using two 2.0 mm pins. 3.  Stress evaluation of the right ankle syndesmosis. 4.  Open reduction internal fixation of right ankle syndesmosis with 2 quadricortical screws from the [employer] system.  Discharged Condition: good  Hospital Course:   Patient is a 54 year old male with history of methadone use x10 years who sustained a ground-level fall approximately 10 days ago with right trimalleolar ankle fracture dislocation with persistent subluxation.  Patient has been seen in the office yearly to evaluate the soft tissue.  His swelling has finally subsided enough to allow for definitive intervention.  Patient was taken to the OR on 01/08/2020 for the procedure noted above.  Patient  tolerated procedure very well due to the complexity and length of the procedure we did keep the patient overnight for observation.  He did have a preoperative popliteal block which remained effective on postoperative day #1.  Patient has done exceedingly well on postop day #1 he had already mobilized with therapy by the time I rounded on him and was already dressed and ready to discharge.  No issues were noted.  He was tolerating regular diet voiding without any difficulty.  He did receive perioperative antibiotics for routine coverage.  Some lab work did show some very mild vitamin D deficiency.  He will resume vitamin D supplementation.  He was given a dose of Lovenox during his admission and will continue on aspirin 325 mg twice daily for the next month for DVT prophylaxis.  Patient will remain strict nonweightbearing on his right leg for 8 weeks.  Will check him back in the office in 2 weeks for removal of his splint and placement into a cam boot versus a cast.  We will likely leave his tibiotalar calcaneal pin in place until about the 4-8-week mark and these will be removed in the office.  We would encourage him to have his syndesmotic screws removed at around a 25-month mark as well.  Patient discharged in stable condition on 01/09/2020  Consults: None  Significant Diagnostic Studies: labs:   Results for ALBEN, JEPSEN (MRN [id number]) as of 01/09/2020 11:28  Ref. Range 01/09/2020 05:02  COMPREHENSIVE METABOLIC PANEL Unknown Rpt (A)  Sodium Latest Ref Range: 135 - 145 mmol/L 135  Potassium Latest Ref Range: 3.5 - 5.1 mmol/L 4.7  Chloride Latest Ref Range: 98 - 111 mmol/L 103  CO2 Latest Ref Range:  22 - 32 mmol/L 25  Glucose Latest Ref Range: 70 - 99 mg/dL 161 (H)  BUN Latest Ref Range: 6 - 20 mg/dL 8  Creatinine Latest Ref Range: 0.61 - 1.24 mg/dL 0.96  Calcium Latest Ref Range: 8.9 - 10.3 mg/dL 8.5 (L)  Anion gap Latest Ref Range: 5 - 15  7  Alkaline Phosphatase Latest Ref Range: 38 - 126 U/L 73   Albumin Latest Ref Range: 3.5 - 5.0 g/dL 2.8 (L)  AST Latest Ref Range: 15 - 41 U/L 56 (H)  ALT Latest Ref Range: 0 - 44 U/L 42  Total Protein Latest Ref Range: 6.5 - 8.1 g/dL 6.5  Total Bilirubin Latest Ref Range: 0.3 - 1.2 mg/dL 0.5  GFR, Est Non African American Latest Ref Range: >60 mL/min >60  GFR, Est African American Latest Ref Range: >60 mL/min >60  Vitamin D, 25-Hydroxy Latest Ref Range: 30 - 100 ng/mL 19.68 (L)  WBC Latest Ref Range: 4.0 - 10.5 K/uL 9.4  RBC Latest Ref Range: 4.22 - 5.81 MIL/uL 4.59  Hemoglobin Latest Ref Range: 13.0 - 17.0 g/dL 04.5  HCT Latest Ref Range: 39.0 - 52.0 % 42.0  MCV Latest Ref Range: 80.0 - 100.0 fL 91.5  MCH Latest Ref Range: 26.0 - 34.0 pg 29.4  MCHC Latest Ref Range: 30.0 - 36.0 g/dL 40.9  RDW Latest Ref Range: 11.5 - 15.5 % 12.9  Platelets Latest Ref Range: 150 - 400 K/uL 203  nRBC Latest Ref Range: 0.0 - 0.2 % 0.0   Treatments: IV hydration, antibiotics: Ancef, analgesia: Methadone, IV Dilaudid, Norco and Ultram, scheduled Tylenol and Robaxin, anticoagulation: LMW heparin and aspirin at discharge, therapies: PT, OT and RN and surgery: As above  Discharge Exam:   Orthopaedic Trauma Service Progress Note   Patient ID: Greco Gastelum MRN: [id number] DOB/AGE: 1966/01/19 54 y.o.   Subjective:   Doing well Block still working  Ready to go home Has already worked with therapy    On methadone x 10 years Previous heroin abuser    Very appreciative of care    No CP No SOB No N/v No abd pain    ROS As above   Objective:    VITALS:         Vitals:    01/08/20 2230 01/08/20 2254 01/09/20 0358 01/09/20 0802  BP: 110/69 111/69 113/66 (!) 102/56  Pulse: 77 73 81 78  Resp: Temp: (!) 97.4 F (36.3 C) 98.6 F (37 C) 98.6 F (37 C) 98.5 F (36.9 C)  TempSrc:   Oral Oral Oral  SpO2: 94% 97% 94% 92%  Weight:          Height:              Estimated body mass index is 35.44 kg/m as calculated from the following:    Height as of this encounter:  (1.753 m).   Weight as of this encounter: 108.9 kg.     Intake/Output      05/06 0701 - 05/07 0700 05/07 0701 - 05/08 0700   I.V. (mL/kg) 2400 (22)    IV Piggyback 50    Total Intake(mL/kg) 2450 (22.5)    Urine (mL/kg/hr) 700 550 (1.3)   Blood 150    Total Output 850 550   Net +1600 -550           LABS   Lab Results Last 24 Hours       Results for orders placed  or performed during the hospital encounter of 01/08/20 (from the past 24 hour(s))  Respiratory Panel by RT PCR (Flu A&B, Covid) - Nasopharyngeal Swab     Status: None    Collection Time: 01/08/20 12:48 PM    Specimen: Nasopharyngeal Swab  Result Value Ref Range    SARS Coronavirus 2 by RT PCR NEGATIVE NEGATIVE    Influenza A by PCR NEGATIVE NEGATIVE    Influenza B by PCR NEGATIVE NEGATIVE  CBC     Status: None    Collection Time: 01/08/20 11:06 PM  Result Value Ref Range    WBC 7.6 4.0 - 10.5 K/uL    RBC 4.82 4.22 - 5.81 MIL/uL    Hemoglobin 14.4 13.0 - 17.0 g/dL    HCT 30.8 65.7 - 84.6 %    MCV 92.7 80.0 - 100.0 fL    MCH 29.9 26.0 - 34.0 pg    MCHC 32.2 30.0 - 36.0 g/dL    RDW 96.2 95.2 - 84.1 %    Platelets 201 150 - 400 K/uL    nRBC 0.0 0.0 - 0.2 %  Creatinine, serum     Status: None    Collection Time: 01/08/20 11:06 PM  Result Value Ref Range    Creatinine, Ser 1.05 0.61 - 1.24 mg/dL    GFR calc non Af Amer >60 >60 mL/min    GFR calc Af Amer >60 >60 mL/min  VITAMIN D 25 Hydroxy (Vit-D Deficiency, Fractures)     Status: Abnormal    Collection Time: 01/09/20  5:02 AM  Result Value Ref Range    Vit D, 25-Hydroxy 19.68 (L) 30 - 100 ng/mL  Comprehensive metabolic panel     Status: Abnormal    Collection Time: 01/09/20  5:02 AM  Result Value Ref Range    Sodium 135 135 - 145 mmol/L    Potassium 4.7 3.5 - 5.1 mmol/L    Chloride 103 98 - 111 mmol/L    CO2 25 22 - 32 mmol/L    Glucose, Bld 237 (H) 70 - 99 mg/dL    BUN 8 6 - 20 mg/dL    Creatinine, Ser 3.24 0.61 - 1.24  mg/dL    Calcium 8.5 (L) 8.9 - 10.3 mg/dL    Total Protein 6.5 6.5 - 8.1 g/dL    Albumin 2.8 (L) 3.5 - 5.0 g/dL    AST 56 (H) 15 - 41 U/L    ALT 42 0 - 44 U/L    Alkaline Phosphatase 73 38 - 126 U/L    Total Bilirubin 0.5 0.3 - 1.2 mg/dL    GFR calc non Af Amer >60 >60 mL/min    GFR calc Af Amer >60 >60 mL/min    Anion gap 7 5 - 15  CBC     Status: None    Collection Time: 01/09/20  5:02 AM  Result Value Ref Range    WBC 9.4 4.0 - 10.5 K/uL    RBC 4.59 4.22 - 5.81 MIL/uL    Hemoglobin 13.5 13.0 - 17.0 g/dL    HCT 40.1 02.7 - 25.3 %    MCV 91.5 80.0 - 100.0 fL    MCH 29.4 26.0 - 34.0 pg    MCHC 32.1 30.0 - 36.0 g/dL    RDW 66.4 40.3 - 47.4 %    Platelets 203 150 - 400 K/uL    nRBC 0.0 0.0 - 0.2 %          PHYSICAL EXAM:    Gen:  sitting up in bed, dressed in regular street clothes, NAD, pleasant  Lungs: unlabored Cardiac: regular  Ext:       Right Lower Extremity              Splint c/d/i             Splint fitting well             Ext warm              + DP Pulse             No pain out of proportion with passive stretching              No motor or sensory function noted due to block              Swelling well controlled                 Assessment/Plan: 1 Day Post-Op    Principal Problem:   Closed displaced trimalleolar fracture of right ankle Active Problems:   Vitamin D deficiency   Depression   History of heroin abuse (HCC)   Methadone maintenance therapy patient (HCC)                Anti-infectives (From admission, onward)      Start     Dose/Rate Route Frequency Ordered Stop    01/08/20 2359   ceFAZolin (ANCEF) IVPB 2g/100 mL premix     2 g 200 mL/hr over 30 Minutes Intravenous Every 6 hours 01/08/20 2247 01/09/20 1759       .   POD/HD#: 1   54 y/o male with complex R trimalleolar ankle fracture dislocation with syndesmotic disruption   -R trimalleolar ankle fracture dislocation with syndesmotic disruption s/p ORIF              Nonweightbearing x 8 weeks use walker or wheelchair to mobilize             Splint x2 weeks then convert to cast.  Would not be able to perform ankle range of motion exercises until tibiotalar calcaneal pins are removed which will be at about the 4 to 8-week mark             Aggressive ice and elevation for swelling and pain control             Patient has been cleared by therapies will not require outpatient therapy until he is weightbearing possibly sooner if there are issues with range of motion                         We will need to be aggressive with heel cord stretching.  Patient was very tight in terms of his heel cord in the OR               Patient is to keep his splint on until follow-up.  Does not need to remove.  Is to keep it clean and dry   - Pain management:             We did discuss his past substance abuse.  The other choice is heroin which is why he is on methadone.             Continue his methadone to avoid withdrawal symptoms             We will add low-dose Norco and Ultram.  Quick titration off Norco  and anticipate off of Ultram by 4 weeks postop.  Preferably sooner as possible             Scheduled tylenol 500 mg po q12h   - ABL anemia/Hemodynamics             Stable   - Medical issues              Chronic methadone                         Continue methadone                         Goes to clinic daily   - DVT/PE prophylaxis:             Aspirin 325 mg twice daily x 4 weeks   - ID:              Perioperative antibiotics completed   - Metabolic Bone Disease:             + Vitamin D deficiency                         Supplement                         Meets criteria for fracture liaison consultation   - Activity:             Nonweightbearing right leg x 8 weeks   - FEN/GI prophylaxis/Foley/Lines:             Regular diet             Discontinue IV and IV fluids              - Impediments to fracture healing:             Chronic methadone use              Vitamin D deficiency             Clinically poor bone quality   - Dispo:             Discharge home today, follow-up with orthopedics in 10 to 14 days   Disposition: Discharge disposition: 01-Home or Self Care       Discharge Instructions    Call MD / Call 911   Complete by: As directed    If you experience chest pain or shortness of breath, CALL 911 and be transported to the hospital emergency room.  If you develope a fever above 101 F, pus (white drainage) or increased drainage or redness at the wound, or calf pain, call your surgeon's office.   Constipation Prevention   Complete by: As directed    Drink plenty of fluids.  Prune juice may be helpful.  You may use a stool softener, such as Colace (over the counter) 100 mg twice a day.  Use MiraLax (over the counter) for constipation as needed.   Diet general   Complete by: As directed    Discharge instructions   Complete by: As directed    Orthopaedic Trauma Service Discharge Instructions   General Discharge Instructions  Orthopaedic Injuries:  Right trimalleolar ankle fracture dislocation treated with open reduction internal fixation using plates and screws  WEIGHT BEARING STATUS: Nonweightbearing right leg using walker or knee scooter  RANGE OF MOTION/ACTIVITY: Okay to move toes  and knee.  Do not remove splint.  Activity as tolerated while maintaining weightbearing restrictions.  Keep right leg elevated as much as possible to help with swelling and pain control  Bone health: Labs do show a little bit of vitamin D deficiency.  Continue to take daily vitamin D supplementation.  Wound Care: Keep splint clean and dry.  Do not remove for any reason.  Please call the office if your splint gets to light.  We will remove splint at your first postoperative visit  DVT/PE prophylaxis: Aspirin 325 mg twice a day x 4 weeks  Diet: as you were eating previously.  Can use over the counter stool softeners and bowel preparations, such  as Miralax, to help with bowel movements.  Narcotics can be constipating.  Be sure to drink plenty of fluids  PAIN MEDICATION USE AND EXPECTATIONS  You have likely been given narcotic medications to help control your pain.  After a traumatic event that results in an fracture (broken bone) with or without surgery, it is ok to use narcotic pain medications to help control one's pain.  We understand that everyone responds to pain differently and each individual patient will be evaluated on a regular basis for the continued need for narcotic medications. Ideally, narcotic medication use should last no more than 6-8 weeks (coinciding with fracture healing).   As a patient it is your responsibility as well to monitor narcotic medication use and report the amount and frequency you use these medications when you come to your office visit.   We would also advise that if you are using narcotic medications, you should take a dose prior to therapy to maximize you participation.  IF YOU ARE ON NARCOTIC MEDICATIONS IT IS NOT PERMISSIBLE TO OPERATE A MOTOR VEHICLE (MOTORCYCLE/CAR/TRUCK/MOPED) OR HEAVY MACHINERY DO NOT MIX NARCOTICS WITH OTHER CNS (CENTRAL NERVOUS SYSTEM) DEPRESSANTS SUCH AS ALCOHOL   STOP SMOKING OR USING NICOTINE PRODUCTS!!!!  As discussed nicotine severely impairs your body's ability to heal surgical and traumatic wounds but also impairs bone healing.  Wounds and bone heal by forming microscopic blood vessels (angiogenesis) and nicotine is a vasoconstrictor (essentially, shrinks blood vessels).  Therefore, if vasoconstriction occurs to these microscopic blood vessels they essentially disappear and are unable to deliver necessary nutrients to the healing tissue.  This is one modifiable factor that you can do to dramatically increase your chances of healing your injury.    (This means no smoking, no nicotine gum, patches, etc)  DO NOT USE NONSTEROIDAL ANTI-INFLAMMATORY DRUGS (NSAID'S)  Using  products such as Advil (ibuprofen), Aleve (naproxen), Motrin (ibuprofen) for additional pain control during fracture healing can delay and/or prevent the healing response.  If you would like to take over the counter (OTC) medication, Tylenol (acetaminophen) is ok.  However, some narcotic medications that are given for pain control contain acetaminophen as well. Therefore, you should not exceed more than 4000 mg of tylenol in a day if you do not have liver disease.  Also note that there are may OTC medicines, such as cold medicines and allergy medicines that my contain tylenol as well.  If you have any questions about medications and/or interactions please ask your doctor/PA or your pharmacist.      ICE AND ELEVATE INJURED/OPERATIVE EXTREMITY  Using ice and elevating the injured extremity above your heart can help with swelling and pain control.  Icing in a pulsatile fashion, such as 20 minutes on and 20 minutes off, can be followed.    Do  not place ice directly on skin. Make sure there is a barrier between to skin and the ice pack.    Using frozen items such as frozen peas works well as the conform nicely to the are that needs to be iced.  USE AN ACE WRAP OR TED HOSE FOR SWELLING CONTROL  In addition to icing and elevation, Ace wraps or TED hose are used to help limit and resolve swelling.  It is recommended to use Ace wraps or TED hose until you are informed to stop.    When using Ace Wraps start the wrapping distally (farthest away from the body) and wrap proximally (closer to the body)   Example: If you had surgery on your leg or thing and you do not have a splint on, start the ace wrap at the toes and work your way up to the thigh        If you had surgery on your upper extremity and do not have a splint on, start the ace wrap at your fingers and work your way up to the upper arm  IF YOU ARE IN A SPLINT OR CAST DO NOT REMOVE IT FOR ANY REASON   If your splint gets wet for any reason please contact  the office immediately. You may shower in your splint or cast as long as you keep it dry.  This can be done by wrapping in a cast cover or garbage back (or similar)  Do Not stick any thing down your splint or cast such as pencils, money, or hangers to try and scratch yourself with.  If you feel itchy take benadryl as prescribed on the bottle for itching  IF YOU ARE IN A CAM BOOT (BLACK BOOT)  You may remove boot periodically. Perform daily dressing changes as noted below.  Wash the liner of the boot regularly and wear a sock when wearing the boot. It is recommended that you sleep in the boot until told otherwise    Call office for the following: Temperature greater than 101F Persistent nausea and vomiting Severe uncontrolled pain Redness, tenderness, or signs of infection (pain, swelling, redness, odor or green/yellow discharge around the site) Difficulty breathing, headache or visual disturbances Hives Persistent dizziness or light-headedness Extreme fatigue Any other questions or concerns you may have after discharge  In an emergency, call 911 or go to an Emergency Department at a nearby hospital    CALL THE OFFICE WITH ANY QUESTIONS OR CONCERNS: [phone]   VISIT OUR WEBSITE FOR ADDITIONAL INFORMATION: [email or web address]   Driving restrictions   Complete by: As directed    No driving   Increase activity slowly as tolerated   Complete by: As directed    Non weight bearing   Complete by: As directed    Laterality: right   Extremity: Lower     Allergies as of 01/09/2020   No Known Allergies     Medication List    STOP taking these medications   ofloxacin 0.3 % OTIC solution Commonly known as: FLOXIN     TAKE these medications   acetaminophen 500 MG tablet Commonly known as: TYLENOL Take 1 tablet (500 mg total) by mouth every 12 (twelve) hours.   ascorbic acid 500 MG tablet Commonly known as: VITAMIN C Take 1 tablet (500 mg total) by mouth daily.   aspirin 325  MG EC tablet Take 1 tablet (325 mg total) by mouth 2 (two) times daily.   atorvastatin 10 MG tablet Commonly known as: LIPITOR Take  10 mg by mouth at bedtime.   celecoxib 200 MG capsule Commonly known as: CeleBREX Take 1 capsule (200 mg total) by mouth 2 (two) times daily.   citalopram 40 MG tablet Commonly known as: CELEXA Take 40 mg by mouth daily.   dimenhyDRINATE 50 MG tablet Commonly known as: DRAMAMINE Take 50 mg by mouth every 8 (eight) hours as needed for nausea.   docusate sodium 100 MG capsule Commonly known as: COLACE Take 100 mg by mouth daily as needed for mild constipation.   doxepin 75 MG capsule Commonly known as: SINEQUAN Take 75 mg by mouth at bedtime. What changed: Another medication with the same name was removed. Continue taking this medication, and follow the directions you see here.   HYDROcodone-acetaminophen 5-325 MG tablet Commonly known as: NORCO/VICODIN Take 1-2 tablets by mouth every 6 (six) hours as needed for moderate pain (pain score 4-6).   methadone 10 MG/5ML solution Commonly known as: DOLOPHINE Take 88 mg by mouth every morning.   methocarbamol 500 MG tablet Commonly known as: ROBAXIN Take 1-2 tablets (500-1,000 mg total) by mouth every 8 (eight) hours as needed for muscle spasms.   pantoprazole 20 MG tablet Commonly known as: PROTONIX Take 20 mg by mouth daily.   polyethylene glycol 17 g packet Commonly known as: MIRALAX / GLYCOLAX Take 17 g by mouth daily as needed for mild constipation.   pramipexole 0.125 MG tablet Commonly known as: MIRAPEX Take 0.125 mg by mouth at bedtime.   traMADol 50 MG tablet Commonly known as: ULTRAM Take 1 tablet (50 mg total) by mouth every 6 (six) hours as needed for moderate pain.   venlafaxine XR 150 MG 24 hr capsule Commonly known as: EFFEXOR-XR Take 150 mg by mouth daily.   Vitamin D (Ergocalciferol) 1.25 MG (50000 UNIT) Caps capsule Commonly known as: DRISDOL Take 50,000 Units by  mouth once a week. Monday   Vitamin D3 25 MCG tablet Commonly known as: Vitamin D Take 2 tablets (2,000 Units total) by mouth 2 (two) times daily.            Discharge Care Instructions  (From admission, onward)         Start     Ordered   01/09/20 0000  Non weight bearing    Question Answer Comment  Laterality right   Extremity Lower      01/09/20 1121         Follow-up Information    Myrene Galas, MD. Schedule an appointment as soon as possible for a visit in 2 week(s).   Specialty: Orthopedic Surgery Contact information: [address] [place] [address] [phone]           Discharge Instructions and Plan:  Weightbearing: NWB RLE x 8 weeks.  No range of motion for the next 2 weeks of the right ankle.  We will initiate ankle range of motion once tibiotalar calcaneal pins are removed (4-8 weeks post op)  Insicional and dressing care: Dressings left intact until follow-up, keep splint clean and dry.  Do not remove Orthopedic device(s): Splint, walker and knee scooter Showering: Okay to shower as long as splint remains dry.  Cover with bag or cast cover VTE prophylaxis: Aspirin 325mg  BID 4 weeks Pain control: Methadone, scheduled Tylenol and Robaxin.  Ultram and Norco for breakthrough pain.  Quick wean off of Norco in the next 7 to 10 days.  Ultram to be used as primary narcotic for breakthrough pain.  Anticipate him being off  of all narcotics other than his methadone by 4 weeks.  Sooner would be preferable Follow - up plan: 2 weeks Contact information: Astr[address] Divine. Handy MD, Jari Pigg PA-C   Signed:  Jari Pigg, PA-C [phone] (C) 01/09/2020, 11:23 AM  [facility] [place] [address] [phone] Domingo Sep (F)

## 2020-01-09 NOTE — Evaluation (Signed)
Physical Therapy Evaluation Patient Details Name: Gene Gonzalez MRN: [id number] DOB: 03/16/1966 Today's Date: 01/09/2020   History of Present Illness  Pt is a 54 y/o male presents s/p R ankle ORIF after a fall in the kitchen. No significant PMH on file.  Clinical Impression  Pt presented supine in bed with HOB elevated, awake and willing to participate in therapy session. Prior to admission, pt reported that he was independent with all functional mobility and ADL's. At the time of evaluation pt overall at a mod I to min guard level for functional mobility with use of RW. Pt will be staying with friends who will be able to provide appropriate support for him. No further acute PT needs identified at this time. Pt is ready to d/c home from a PT perspective. PT signing off.    Follow Up Recommendations No PT follow up    Equipment Recommendations  None recommended by PT;Other (comment)(pt has all DME at home)    Recommendations for Other Services       Precautions / Restrictions Precautions Precautions: Fall Precaution Comments: R LE splint Required Braces or Orthoses: Other Brace(R ankle splint) Restrictions Weight Bearing Restrictions: Yes RLE Weight Bearing: Non weight bearing      Mobility  Bed Mobility Overal bed mobility: Modified Independent     Transfers Overall transfer level: Modified independent Equipment used: Rolling walker (2 wheeled)     Ambulation/Gait Ambulation/Gait assistance: Min guard Gait Distance (Feet): 15 Feet Assistive device: Rolling walker (2 wheeled) Gait Pattern/deviations: (hop-to on L LE) Gait velocity: decreased   General Gait Details: pt overall steady with RW and able to maintain NWB'ing on R LE independently throughout; limited secondary to fatigue  Stairs    Wheelchair Mobility    Modified Rankin (Stroke Patients Only)      Balance Overall balance assessment: Mild deficits observed, not formally tested                                           Pertinent Vitals/Pain Pain Assessment: No/denies pain(still numb from nerve block)    Home Living Family/patient expects to be discharged to:: Private residence Living Arrangements: Alone;Non-relatives/Friends;Other (Comment)(satying with friends at d/c) Available Help at Discharge: Friend(s);Available 24 hours/day(they [facility]) Type of Home: House Home Access: Ramped entrance     Home Layout: Two level;Able to live on main level with bedroom/bathroom Home Equipment: Dan Humphreys - 2 wheels;Wheelchair - manual      Prior Function Level of Independence: Independent               Hand Dominance        Extremity/Trunk Assessment   Upper Extremity Assessment Upper Extremity Assessment: Defer to OT evaluation;Overall WFL for tasks assessed    Lower Extremity Assessment Lower Extremity Assessment: RLE deficits/detail RLE Deficits / Details: splinted, numb; unable to flex/extend toes RLE: Unable to fully assess due to immobilization       Communication   Communication: No difficulties  Cognition Arousal/Alertness: Awake/alert Behavior During Therapy: WFL for tasks assessed/performed Overall Cognitive Status: Within Functional Limits for tasks assessed                                        General Comments      Exercises General Exercises - Lower Extremity Long Arc  Quad: AROM;Right;10 reps;Seated Other Exercises Other Exercises: PT demonstrated and instructed pt in further HEP including standing hip flexion, hip extension, knee flexion   Assessment/Plan    PT Assessment Patent does not need any further PT services  PT Problem List         PT Treatment Interventions      PT Goals (Current goals can be found in the Care Plan section)  Acute Rehab PT Goals Patient Stated Goal: return to independence PT Goal Formulation: All assessment and education complete, DC therapy    Frequency     Barriers to discharge         Co-evaluation PT/OT/SLP Co-Evaluation/Treatment: Yes Reason for Co-Treatment: For patient/therapist safety;To address functional/ADL transfers PT goals addressed during session: Mobility/safety with mobility;Balance;Proper use of DME;Strengthening/ROM OT goals addressed during session: ADL's and self-care       AM-PAC PT "6 Clicks" Mobility  Outcome Measure Help needed turning from your back to your side while in a flat bed without using bedrails?: None Help needed moving from lying on your back to sitting on the side of a flat bed without using bedrails?: None Help needed moving to and from a bed to a chair (including a wheelchair)?: None Help needed standing up from a chair using your arms (e.g., wheelchair or bedside chair)?: None Help needed to walk in hospital room?: A Little Help needed climbing 3-5 steps with a railing? : A Lot 6 Click Score: 21    End of Session Equipment Utilized During Treatment: Gait belt Activity Tolerance: Patient tolerated treatment well Patient left: in bed;with call bell/phone within reach Nurse Communication: Mobility status PT Visit Diagnosis: Other abnormalities of gait and mobility (R26.89)    Time: 9604-5409 PT Time Calculation (min) (ACUTE ONLY): 16 min   Charges:   PT Evaluation $PT Eval Moderate Complexity: 1 Mod          Eduard Clos, PT, DPT  Acute Rehabilitation Services Pager [phone] Office [place] 01/09/2020, 9:37 AM

## 2020-01-09 NOTE — Progress Notes (Signed)
Patient is discharged from room 3C04 at this time. Alert and in stable condition. IV site d/c'd and instructions read to patient with understanding verbalized. Left unit via wheelchair with all belongings at side. 

## 2020-01-24 NOTE — H&P (Signed)
H&P  Gene Gonzalez is an 54 y.o. male.  HPI: Gene Gonzalez was in his kitchen and pivoted on his right foot on 4/23. He's not sure what happened but heard a pop and had severe pain. He could not bear weight after that. He came to the ED where x-rays showed an ankle fx and orthopedic surgery was consulted. He is currently unemployed. He returns for definitive treatment today after attempted closed reduction was unsuccessful.  No past medical history on file.  No past surgical history on file.  No family history on file.  Social History:  reports that he has been smoking cigarettes. He does not have any smokeless tobacco history on file. He reports that he does not drink alcohol. No history on file for drug.  Allergies: No Known Allergies  Medications: I have reviewed the patient's current medications.  No results found for this or any previous visit (from the past 48 hour(s)).   Review of Systems  HENT: Negative for ear discharge, ear pain, hearing loss and tinnitus.   Eyes: Negative for photophobia and pain.  Respiratory: Negative for cough and shortness of breath.   Cardiovascular: Negative for chest pain.  Gastrointestinal: Negative for abdominal pain, nausea and vomiting.  Genitourinary: Negative for dysuria, flank pain, frequency and urgency.  Musculoskeletal: Positive for arthralgias (Right ankl  AFVSS Physical Exam  Constitutional: He appears well-developed and well-nourished. No distress.  HENT:  Head: Normocephalic and atraumatic.  Eyes: Conjunctivae are normal. Right eye exhibits no discharge. Left eye exhibits no discharge. No scleral icterus.  Cardiovascular: Normal rate and regular rhythm.  Respiratory: Effort normal. No respiratory distress.  Musculoskeletal:     Cervical back: Normal range of motion.     Comments: RLE      No traumatic wounds, ecchymosis, or rash             Severe TTP ankle, deformed, mod edema             No knee effusion             Knee  stable to varus/ valgus and anterior/posterior stress             Sens DPN, SPN, TN intact             Motor EHL 5/5             DP 2+, PT 1+, No significant pedal edema  Neurological: He is alert.  Skin: Skin is warm and dry. He is not diaphoretic.  Psychiatric: He has a normal mood and affect. His behavior is normal.    Assessment/Plan: Right ankle fx Methadone use for addiction Tobacco use

## 2020-01-30 ENCOUNTER — Encounter (HOSPITAL_COMMUNITY): Payer: Self-pay | Admitting: Orthopedic Surgery

## 2020-01-30 NOTE — Progress Notes (Signed)
Patient denies shortness of breath, fever, cough or chest pain.  PCP - [facility] - Ethelene Browns Still, Dr Ready Cardiologist - n/a  Chest x-ray - n/a EKG - n/a Stress Test - n/a ECHO - n/a Cardiac Cath - n/a  STOP now taking any Aspirin (unless otherwise instructed by your surgeon), Aleve, Naproxen, Ibuprofen, Motrin, Advil, Goody's, BC's, all herbal medications, fish oil, and all vitamins.   Coronavirus Screening Covid test scheduled on 01/31/20. Do you have any of the following symptoms:  Cough yes/no: No Fever (>100.26F)  yes/no: No Runny nose yes/no: No Sore throat yes/no: No Difficulty breathing/shortness of breath  yes/no: No  Have you traveled in the last 14 days and where? yes/no: No  Patient verbalized understanding of instructions that were given via phone.

## 2020-01-31 ENCOUNTER — Other Ambulatory Visit (HOSPITAL_COMMUNITY)
Admission: RE | Admit: 2020-01-31 | Discharge: 2020-01-31 | Disposition: A | Discharge status: Home / Self Care | Payer: HRSA Program | Source: Ambulatory Visit | Attending: Orthopedic Surgery | Admitting: Orthopedic Surgery

## 2020-01-31 DIAGNOSIS — Z20822 Contact with and (suspected) exposure to covid-19: Secondary | ICD-10-CM | POA: Diagnosis not present

## 2020-01-31 DIAGNOSIS — Z01812 Encounter for preprocedural laboratory examination: Secondary | ICD-10-CM | POA: Insufficient documentation

## 2020-01-31 LAB — SARS CORONAVIRUS 2 (TAT 6-24 HRS): SARS Coronavirus 2: NEGATIVE

## 2020-02-02 NOTE — H&P (Signed)
Orthopaedic Trauma Service (OTS) Consult   Patient ID: Gene Gonzalez MRN: [id number] DOB/AGE: 1966/05/06 54 y.o.  HPI: Gene Gonzalez is an 54 y.o. male h/o methadone treatment who sustained a R ankle fracture dislocation on 12/26/2019.  This was initially closed reduced in the emergency department and patient followed up in the office for evaluation of the soft tissues for determination of surgery timing.  Patient was ultimately taken to the operating room on 01/08/2020 for repair of his right ankle fracture dislocation.  His ankle was very unstable even after definitive fixation and as such we did place to tibiotalar calcaneal pins.  Patient was seen in the office on 01/28/2020 for his first postoperative visit.  He has been noncompliant with his nonweightbearing restrictions and unfortunately he has broken both of his tibiotalar calcaneal K wires.  Patient presents today for attempted removal of these K wires as the tips are within the intra-articular space of one of the wires and other wire is barely in his talus.  He is at high risk for severe articular damage from these wires if they remain in.  Risks and benefits have been reviewed with the patient and he wishes to proceed.  Past Medical History:  Diagnosis Date  . Anxiety   . Closed dislocation of right ankle 01/09/2020  . Depression   . GERD (gastroesophageal reflux disease)   . Heart murmur    as a child, never has caused any problems  . History of heroin abuse ([place])   . Methadone maintenance therapy patient ([place])   . Syndesmotic disruption of ankle, right, initial encounter 01/09/2020  . Vitamin D deficiency 01/09/2020    Past Surgical History:  Procedure Laterality Date  . ORIF ANKLE FRACTURE Right 01/08/2020   Procedure: OPEN REDUCTION INTERNAL FIXATION (ORIF) RIGHT ANKLE FRACTURE;  Surgeon: Altamese Los Veteranos I, MD;  Location: [place];  Service: Orthopedics;  Laterality: Right;    History reviewed. No pertinent family  history.  Social History:  reports that he has been smoking cigarettes. He has been smoking about 0.50 packs per day. He has never used smokeless tobacco. He reports previous drug use. He reports that he does not drink alcohol.  Allergies: No Known Allergies  Medications: I have reviewed the patient's current medications. No current facility-administered medications on file prior to encounter.   Current Outpatient Medications on File Prior to Encounter  Medication Sig Dispense Refill  . acetaminophen (TYLENOL) 500 MG tablet Take 1 tablet (500 mg total) by mouth every 12 (twelve) hours. 60 tablet 0  . ascorbic acid (VITAMIN C) 500 MG tablet Take 1 tablet (500 mg total) by mouth daily. 60 tablet 0  . aspirin EC 325 MG EC tablet Take 1 tablet (325 mg total) by mouth 2 (two) times daily. 60 tablet 0  . atorvastatin (LIPITOR) 10 MG tablet Take 10 mg by mouth at bedtime.    . celecoxib (CELEBREX) 200 MG capsule Take 1 capsule (200 mg total) by mouth 2 (two) times daily. 30 capsule 0  . cholecalciferol (VITAMIN D) 25 MCG tablet Take 2 tablets (2,000 Units total) by mouth 2 (two) times daily. 120 tablet 3  . citalopram (CELEXA) 40 MG tablet Take 40 mg by mouth daily.    Marland Kitchen dimenhyDRINATE (DRAMAMINE) 50 MG tablet Take 50 mg by mouth every 8 (eight) hours as needed for nausea.    Marland Kitchen docusate sodium (COLACE) 100 MG capsule Take 100 mg by mouth daily as needed for mild constipation.    Marland Kitchen  doxepin (SINEQUAN) 75 MG capsule Take 75 mg by mouth at bedtime.     Marland Kitchen HYDROcodone-acetaminophen (NORCO/VICODIN) 5-325 MG tablet Take 1-2 tablets by mouth every 6 (six) hours as needed for moderate pain (pain score 4-6). 30 tablet 0  . methadone (DOLOPHINE) 10 MG/5ML solution Take 88 mg by mouth every morning.     . methocarbamol (ROBAXIN) 500 MG tablet Take 1-2 tablets (500-1,000 mg total) by mouth every 8 (eight) hours as needed for muscle spasms. 60 tablet 0  . pantoprazole (PROTONIX) 20 MG tablet Take 20 mg by mouth  daily.    . polyethylene glycol (MIRALAX / GLYCOLAX) packet Take 17 g by mouth daily as needed for mild constipation.     . pramipexole (MIRAPEX) 0.125 MG tablet Take 0.125 mg by mouth at bedtime.    . traMADol (ULTRAM) 50 MG tablet Take 1 tablet (50 mg total) by mouth every 6 (six) hours as needed for moderate pain. 50 tablet 0  . venlafaxine XR (EFFEXOR-XR) 150 MG 24 hr capsule Take 150 mg by mouth daily.    . Vitamin D, Ergocalciferol, (DRISDOL) 1.25 MG (50000 UNIT) CAPS capsule Take 50,000 Units by mouth once a week. Monday       No results found for this or any previous visit (from the past 48 hour(s)).  No results found.  Review of Systems  Constitutional: Negative for chills and fever.  Eyes: Negative for blurred vision and double vision.  Respiratory: Negative for cough.   Cardiovascular: Negative for chest pain and palpitations.  Gastrointestinal: Negative for nausea and vomiting.  Genitourinary: Negative for dysuria.  Musculoskeletal: Positive for joint pain (R ankle pain ).  Neurological: Negative for tingling and sensory change.  Psychiatric/Behavioral: Positive for substance abuse (methadone).   Height 5\' 9"  (1.753 m), weight 99.8 kg. Physical Exam Vitals reviewed.  Constitutional:      General: He is awake. He is not in acute distress.    Appearance: He is not ill-appearing.  Cardiovascular:     Rate and Rhythm: Normal rate and regular rhythm.  Pulmonary:     Effort: Pulmonary effort is normal. No tachypnea or accessory muscle usage.  Abdominal:     General: Bowel sounds are normal.     Palpations: Abdomen is soft.  Musculoskeletal:     Comments: Right lower extremity Postoperative wounds are well-healed Swelling is much improved.  Skin wrinkles with gentle compression No signs of infection or drainage noted from his surgical sites Pins that were sticking out of his plantar aspect of his foot are bent and nearly penetrating his skin again.  These were removed  during his office visit DPN, SPN, TN sensory function intact EHL, FHL, lesser toe motor functions intact Extremity is warm + DP pulse Calf is soft, no DCT Compartments are soft and nontender.  No pain with passive stretching Did not range his ankle   Skin:    General: Skin is warm.     Capillary Refill: Capillary refill takes less than 2 seconds.  Neurological:     Mental Status: He is alert and oriented to person, place, and time.  Psychiatric:        Behavior: Behavior is cooperative.        Judgment: Judgment is impulsive.      Assessment/Plan:  54 year old male approximately 4 weeks s/p ORIF right ankle fracture dislocation supplemented with tibiotalar calcaneal pins with broken tibiotalar calcaneal pins with intra-articular violation  -Broken and retained intra-articular hardware right ankle  Discuss  the significance of condition of the hardware with patient.  He is at increased risk for severe articular injury.  OR today for attempted removal of the remaining K wires.  We may only be able to advance them further into the bone hopefully outside the zone of articular cartilage.  This may be done either with an arthrotomy we may evaluate arthroscopically first to the determine the exact location of the K wires.  Outpatient procedure  He will still be nonweightbearing for a month after surgery   Reinforced ad nauseam the importance of maintaining weightbearing restrictions and compliance with medical directions   There is a possibility of the patient is going to discharge directly to a residential drug rehab program  - Pain management:  Patient is only been using his methadone since surgery has not required any additional narcotics  Will write for Tylenol and some anti-inflammatories postop  Does sound to be fairly reasonable candidate for residential drug treatment facility   - Medical issues   Chronic methadone use  - Metabolic Bone Disease:  Vitamin D  deficiency   Continue to supplement  - Impediments to fracture healing:  Chronic methadone use  Inability to follow medical directions  - Dispo:  OR for attempted removal of hardware right ankle    Mearl Latin, PA-C [phone] (C) 02/02/2020, 9:25 PM  [facility] [address] [place] [address] [phone] Collier Bullock (F)

## 2020-02-03 ENCOUNTER — Ambulatory Visit (HOSPITAL_COMMUNITY): Payer: Self-pay | Admitting: Anesthesiology

## 2020-02-03 ENCOUNTER — Other Ambulatory Visit: Payer: Self-pay

## 2020-02-03 ENCOUNTER — Encounter (HOSPITAL_COMMUNITY)
Admission: RE | Disposition: A | Discharge status: Home / Self Care | Payer: Self-pay | Source: Home / Self Care | Attending: Orthopedic Surgery

## 2020-02-03 ENCOUNTER — Ambulatory Visit (HOSPITAL_COMMUNITY): Payer: Self-pay

## 2020-02-03 ENCOUNTER — Encounter (HOSPITAL_COMMUNITY): Payer: Self-pay | Admitting: Orthopedic Surgery

## 2020-02-03 ENCOUNTER — Ambulatory Visit (HOSPITAL_COMMUNITY)
Admission: RE | Admit: 2020-02-03 | Discharge: 2020-02-03 | Disposition: A | Discharge status: Home / Self Care | Payer: Self-pay | Attending: Orthopedic Surgery | Admitting: Orthopedic Surgery

## 2020-02-03 DIAGNOSIS — F329 Major depressive disorder, single episode, unspecified: Secondary | ICD-10-CM | POA: Insufficient documentation

## 2020-02-03 DIAGNOSIS — F17210 Nicotine dependence, cigarettes, uncomplicated: Secondary | ICD-10-CM | POA: Insufficient documentation

## 2020-02-03 DIAGNOSIS — T84116A Breakdown (mechanical) of internal fixation device of bone of right lower leg, initial encounter: Secondary | ICD-10-CM | POA: Insufficient documentation

## 2020-02-03 DIAGNOSIS — Z9119 Patient's noncompliance with other medical treatment and regimen: Secondary | ICD-10-CM | POA: Insufficient documentation

## 2020-02-03 DIAGNOSIS — Z419 Encounter for procedure for purposes other than remedying health state, unspecified: Secondary | ICD-10-CM

## 2020-02-03 DIAGNOSIS — K219 Gastro-esophageal reflux disease without esophagitis: Secondary | ICD-10-CM | POA: Insufficient documentation

## 2020-02-03 DIAGNOSIS — Z79899 Other long term (current) drug therapy: Secondary | ICD-10-CM | POA: Insufficient documentation

## 2020-02-03 DIAGNOSIS — F419 Anxiety disorder, unspecified: Secondary | ICD-10-CM | POA: Insufficient documentation

## 2020-02-03 DIAGNOSIS — Z7982 Long term (current) use of aspirin: Secondary | ICD-10-CM | POA: Insufficient documentation

## 2020-02-03 DIAGNOSIS — F1120 Opioid dependence, uncomplicated: Secondary | ICD-10-CM | POA: Insufficient documentation

## 2020-02-03 DIAGNOSIS — X58XXXA Exposure to other specified factors, initial encounter: Secondary | ICD-10-CM | POA: Insufficient documentation

## 2020-02-03 DIAGNOSIS — Z6832 Body mass index (BMI) 32.0-32.9, adult: Secondary | ICD-10-CM | POA: Insufficient documentation

## 2020-02-03 DIAGNOSIS — T8484XA Pain due to internal orthopedic prosthetic devices, implants and grafts, initial encounter: Secondary | ICD-10-CM

## 2020-02-03 HISTORY — DX: Gastro-esophageal reflux disease without esophagitis: K21.9

## 2020-02-03 HISTORY — PX: HARDWARE REMOVAL: SHX979

## 2020-02-03 HISTORY — DX: Anxiety disorder, unspecified: F41.9

## 2020-02-03 LAB — SURGICAL PCR SCREEN
MRSA, PCR: NEGATIVE
Staphylococcus aureus: NEGATIVE

## 2020-02-03 LAB — RAPID URINE DRUG SCREEN, HOSP PERFORMED
Amphetamines: NOT DETECTED
Barbiturates: NOT DETECTED
Benzodiazepines: NOT DETECTED
Cocaine: POSITIVE — AB
Opiates: POSITIVE — AB
Tetrahydrocannabinol: NOT DETECTED

## 2020-02-03 LAB — COMPREHENSIVE METABOLIC PANEL
ALT: 38 U/L (ref 0–44)
AST: 44 U/L — ABNORMAL HIGH (ref 15–41)
Albumin: 3.6 g/dL (ref 3.5–5.0)
Alkaline Phosphatase: 101 U/L (ref 38–126)
Anion gap: 15 (ref 5–15)
BUN: 9 mg/dL (ref 6–20)
CO2: 30 mmol/L (ref 22–32)
Calcium: 9.3 mg/dL (ref 8.9–10.3)
Chloride: 96 mmol/L — ABNORMAL LOW (ref 98–111)
Creatinine, Ser: 0.97 mg/dL (ref 0.61–1.24)
GFR calc Af Amer: >60 mL/min (ref >60)
GFR calc non Af Amer: >60 mL/min (ref >60)
Glucose, Bld: 125 mg/dL — ABNORMAL HIGH (ref 70–99)
Potassium: 3.7 mmol/L (ref 3.5–5.1)
Sodium: 141 mmol/L (ref 135–145)
Total Bilirubin: 0.3 mg/dL (ref 0.3–1.2)
Total Protein: 7.8 g/dL (ref 6.5–8.1)

## 2020-02-03 LAB — CBC
HCT: 46.9 % (ref 39.0–52.0)
Hemoglobin: 14.6 g/dL (ref 13.0–17.0)
MCH: 29.6 pg (ref 26.0–34.0)
MCHC: 31.1 g/dL (ref 30.0–36.0)
MCV: 94.9 fL (ref 80.0–100.0)
Platelets: 236 10*3/uL (ref 150–400)
RBC: 4.94 MIL/uL (ref 4.22–5.81)
RDW: 13.3 % (ref 11.5–15.5)
WBC: 7 10*3/uL (ref 4.0–10.5)
nRBC: 0 % (ref 0.0–0.2)

## 2020-02-03 SURGERY — REMOVAL, HARDWARE
Anesthesia: General | Laterality: Right

## 2020-02-03 MED ORDER — CHLORHEXIDINE GLUCONATE 0.12 % MT SOLN
OROMUCOSAL | Status: AC
  Filled 2020-02-03: qty 15

## 2020-02-03 MED ORDER — CEFAZOLIN SODIUM-DEXTROSE 2-4 GM/100ML-% IV SOLN
2.0000 g | INTRAVENOUS | Status: AC
  Administered 2020-02-03: 2 g via INTRAVENOUS

## 2020-02-03 MED ORDER — LACTATED RINGERS IV SOLN: INTRAVENOUS | Status: DC | PRN

## 2020-02-03 MED ORDER — METHADONE HCL 5 MG PO TABS
87.5000 mg | ORAL_TABLET | Freq: Once | ORAL | Status: AC
  Administered 2020-02-03: 87.5 mg via ORAL
  Filled 2020-02-03: qty 2

## 2020-02-03 MED ORDER — FENTANYL CITRATE (PF) 100 MCG/2ML IJ SOLN
INTRAMUSCULAR | Status: DC | PRN
  Administered 2020-02-03 (×2): 50 ug via INTRAVENOUS
  Administered 2020-02-03: 150 ug via INTRAVENOUS

## 2020-02-03 MED ORDER — CHLORHEXIDINE GLUCONATE 0.12 % MT SOLN: 15.0000 mL | Freq: Once | OROMUCOSAL | Status: DC

## 2020-02-03 MED ORDER — ACETAMINOPHEN 160 MG/5ML PO SOLN: 1000.0000 mg | Freq: Once | ORAL | Status: DC | PRN

## 2020-02-03 MED ORDER — CEFAZOLIN SODIUM-DEXTROSE 2-4 GM/100ML-% IV SOLN
INTRAVENOUS | Status: AC
  Filled 2020-02-03: qty 100

## 2020-02-03 MED ORDER — SUGAMMADEX SODIUM 500 MG/5ML IV SOLN
INTRAVENOUS | Status: DC | PRN
  Administered 2020-02-03: 250 mg via INTRAVENOUS

## 2020-02-03 MED ORDER — 0.9 % SODIUM CHLORIDE (POUR BTL) OPTIME
TOPICAL | Status: DC | PRN
  Administered 2020-02-03: 1000 mL

## 2020-02-03 MED ORDER — ROCURONIUM BROMIDE 10 MG/ML (PF) SYRINGE
PREFILLED_SYRINGE | INTRAVENOUS | Status: DC | PRN
  Administered 2020-02-03: 70 mg via INTRAVENOUS

## 2020-02-03 MED ORDER — ONDANSETRON HCL 4 MG/2ML IJ SOLN
INTRAMUSCULAR | Status: DC | PRN
  Administered 2020-02-03: 4 mg via INTRAVENOUS

## 2020-02-03 MED ORDER — ACETAMINOPHEN 500 MG PO TABS: 1000.0000 mg | ORAL_TABLET | Freq: Four times a day (QID) | ORAL | 0 refills | Status: AC | PRN

## 2020-02-03 MED ORDER — METHADONE HCL 5 MG PO TABS: 88.0000 mg | ORAL_TABLET | Freq: Every day | ORAL | Status: DC

## 2020-02-03 MED ORDER — DEXAMETHASONE SODIUM PHOSPHATE 10 MG/ML IJ SOLN
INTRAMUSCULAR | Status: DC | PRN
  Administered 2020-02-03: 10 mg via INTRAVENOUS

## 2020-02-03 MED ORDER — POVIDONE-IODINE 10 % EX SWAB: 2.0000 "application " | Freq: Once | CUTANEOUS | Status: DC

## 2020-02-03 MED ORDER — FENTANYL CITRATE (PF) 100 MCG/2ML IJ SOLN: 25.0000 ug | INTRAMUSCULAR | Status: DC | PRN

## 2020-02-03 MED ORDER — PROPOFOL 10 MG/ML IV BOLUS
INTRAVENOUS | Status: AC
  Filled 2020-02-03: qty 20

## 2020-02-03 MED ORDER — LIDOCAINE 2% (20 MG/ML) 5 ML SYRINGE
INTRAMUSCULAR | Status: DC | PRN
  Administered 2020-02-03: 100 mg via INTRAVENOUS

## 2020-02-03 MED ORDER — CHLORHEXIDINE GLUCONATE 4 % EX LIQD: 60.0000 mL | Freq: Once | CUTANEOUS | Status: DC

## 2020-02-03 MED ORDER — ACETAMINOPHEN 325 MG PO TABS
ORAL_TABLET | ORAL | Status: DC | PRN
Start: 2020-02-03 — End: 2020-02-03
  Administered 2020-02-03: 1000 mg via ORAL

## 2020-02-03 MED ORDER — MIDAZOLAM HCL 2 MG/2ML IJ SOLN
INTRAMUSCULAR | Status: AC
  Filled 2020-02-03: qty 2

## 2020-02-03 MED ORDER — MIDAZOLAM HCL 5 MG/5ML IJ SOLN
INTRAMUSCULAR | Status: DC | PRN
  Administered 2020-02-03: 2 mg via INTRAVENOUS

## 2020-02-03 MED ORDER — ACETAMINOPHEN 10 MG/ML IV SOLN: 1000.0000 mg | Freq: Once | INTRAVENOUS | Status: DC | PRN

## 2020-02-03 MED ORDER — ACETAMINOPHEN 500 MG PO TABS: 1000.0000 mg | ORAL_TABLET | Freq: Once | ORAL | Status: DC | PRN

## 2020-02-03 MED ORDER — OXYCODONE HCL 5 MG/5ML PO SOLN: 5.0000 mg | Freq: Once | ORAL | Status: DC | PRN

## 2020-02-03 MED ORDER — OXYCODONE HCL 5 MG PO TABS: 5.0000 mg | ORAL_TABLET | Freq: Once | ORAL | Status: DC | PRN

## 2020-02-03 MED ORDER — KETAMINE HCL 10 MG/ML IJ SOLN
INTRAMUSCULAR | Status: DC | PRN
Start: 2020-02-03 — End: 2020-02-03
  Administered 2020-02-03: 10 mg via INTRAVENOUS
  Administered 2020-02-03: 25 mg via INTRAVENOUS

## 2020-02-03 MED ORDER — METHADONE HCL 10 MG/ML PO CONC
88.0000 mg | Freq: Once | ORAL | Status: DC
  Filled 2020-02-03: qty 8.8

## 2020-02-03 MED ORDER — ACETAMINOPHEN 500 MG PO TABS
ORAL_TABLET | ORAL | Status: AC
  Filled 2020-02-03: qty 2

## 2020-02-03 MED ORDER — FENTANYL CITRATE (PF) 250 MCG/5ML IJ SOLN
INTRAMUSCULAR | Status: AC
  Filled 2020-02-03: qty 5

## 2020-02-03 MED ORDER — ORAL CARE MOUTH RINSE: 15.0000 mL | Freq: Once | OROMUCOSAL | Status: DC

## 2020-02-03 MED ORDER — KETAMINE HCL 50 MG/5ML IJ SOSY
PREFILLED_SYRINGE | INTRAMUSCULAR | Status: AC
  Filled 2020-02-03: qty 5

## 2020-02-03 MED ORDER — PROPOFOL 10 MG/ML IV BOLUS
INTRAVENOUS | Status: DC | PRN
  Administered 2020-02-03: 10 mg via INTRAVENOUS
  Administered 2020-02-03: 40 mg via INTRAVENOUS
  Administered 2020-02-03: 10 mg via INTRAVENOUS
  Administered 2020-02-03: 160 mg via INTRAVENOUS

## 2020-02-03 MED ORDER — KETOROLAC TROMETHAMINE 10 MG PO TABS
10.0000 mg | ORAL_TABLET | Freq: Four times a day (QID) | ORAL | 0 refills | Status: AC | PRN
Start: 2020-02-03 — End: ?

## 2020-02-03 SURGICAL SUPPLY — 41 items
BNDG ELASTIC 6X10 VLCR STRL LF (GAUZE/BANDAGES/DRESSINGS) ×2 IMPLANT
BNDG GAUZE ELAST 4 BULKY (GAUZE/BANDAGES/DRESSINGS) ×2 IMPLANT
BRUSH SCRUB EZ PLAIN DRY (MISCELLANEOUS) ×6 IMPLANT
COVER SURGICAL LIGHT HANDLE (MISCELLANEOUS) ×4 IMPLANT
COVER WAND RF STERILE (DRAPES) ×3 IMPLANT
CUFF TOURN SGL QUICK 34 (TOURNIQUET CUFF) ×2
CUFF TRNQT CYL 34X4.125X (TOURNIQUET CUFF) IMPLANT
DRAPE C-ARM 42X72 X-RAY (DRAPES) ×2 IMPLANT
DRAPE C-ARMOR (DRAPES) ×3 IMPLANT
DRAPE U-SHAPE 47X51 STRL (DRAPES) ×3 IMPLANT
DRSG EMULSION OIL 3X3 NADH (GAUZE/BANDAGES/DRESSINGS) ×2 IMPLANT
ELECT REM PT RETURN 9FT ADLT (ELECTROSURGICAL) ×3
ELECTRODE REM PT RTRN 9FT ADLT (ELECTROSURGICAL) ×1 IMPLANT
GAUZE SPONGE 4X4 12PLY STRL LF (GAUZE/BANDAGES/DRESSINGS) ×2 IMPLANT
GLOVE BIO SURGEON STRL SZ7.5 (GLOVE) ×3 IMPLANT
GLOVE BIO SURGEON STRL SZ8 (GLOVE) ×3 IMPLANT
GLOVE BIOGEL PI IND STRL 7.5 (GLOVE) ×1 IMPLANT
GLOVE BIOGEL PI IND STRL 8 (GLOVE) ×1 IMPLANT
GLOVE BIOGEL PI INDICATOR 7.5 (GLOVE) ×2
GLOVE BIOGEL PI INDICATOR 8 (GLOVE) ×2
GOWN STRL REUS W/ TWL LRG LVL3 (GOWN DISPOSABLE) ×2 IMPLANT
GOWN STRL REUS W/ TWL XL LVL3 (GOWN DISPOSABLE) ×1 IMPLANT
GOWN STRL REUS W/TWL LRG LVL3 (GOWN DISPOSABLE) ×4
GOWN STRL REUS W/TWL XL LVL3 (GOWN DISPOSABLE) ×2
KIT BASIN OR (CUSTOM PROCEDURE TRAY) ×3 IMPLANT
KIT TURNOVER KIT B (KITS) ×3 IMPLANT
MANIFOLD NEPTUNE II (INSTRUMENTS) ×3 IMPLANT
NS IRRIG 1000ML POUR BTL (IV SOLUTION) ×3 IMPLANT
PACK ORTHO EXTREMITY (CUSTOM PROCEDURE TRAY) ×3 IMPLANT
PADDING CAST SYN 6 (CAST SUPPLIES) ×2
PADDING CAST SYNTHETIC 6X4 NS (CAST SUPPLIES) IMPLANT
SPONGE LAP 18X18 RF (DISPOSABLE) ×3 IMPLANT
SUT ETHILON 2 0 PSLX (SUTURE) ×2 IMPLANT
SUT ETHILON 3 0 PS 1 (SUTURE) ×2 IMPLANT
SUT PDS AB 2-0 CT1 27 (SUTURE) ×2 IMPLANT
TOWEL GREEN STERILE (TOWEL DISPOSABLE) ×6 IMPLANT
TOWEL GREEN STERILE FF (TOWEL DISPOSABLE) ×6 IMPLANT
TUBE CONNECTING 12'X1/4 (SUCTIONS) ×1
TUBE CONNECTING 12X1/4 (SUCTIONS) ×2 IMPLANT
UNDERPAD 30X36 HEAVY ABSORB (UNDERPADS AND DIAPERS) ×3 IMPLANT
YANKAUER SUCT BULB TIP NO VENT (SUCTIONS) ×3 IMPLANT

## 2020-02-03 NOTE — Progress Notes (Signed)
Wasted 2.5mg  of methadone tablet in sharps with Richardo Priest, RN.

## 2020-02-03 NOTE — Transfer of Care (Signed)
Immediate Anesthesia Transfer of Care Note  Patient: Gene Gonzalez  Procedure(s) Performed: HARDWARE REMOVAL ANKLE WITH ARTHROTOMY VS ARTHROSCOPY (Right )  Patient Location: PACU  Anesthesia Type:General  Level of Consciousness: awake  Airway & Oxygen Therapy: Patient Spontanous Breathing and Patient connected to face mask oxygen  Post-op Assessment: Report given to RN, Post -op Vital signs reviewed and stable and Patient moving all extremities X 4  Post vital signs: Reviewed and stable  Last Vitals:  Vitals Value Taken Time  BP    Temp    Pulse 79 02/03/20 1024  Resp 12 02/03/20 1024  SpO2 95 % 02/03/20 1024  Vitals shown include unvalidated device data.  Last Pain:  Vitals:   02/03/20 0646  TempSrc:   PainSc: 0-No pain         Complications: No apparent anesthesia complications

## 2020-02-03 NOTE — Op Note (Signed)
NAMEDEVARIUS, NELLES MEDICAL RECORD YI:[id number] ACCOUNT [id number] DATE OF BIRTH:[birth year] FACILITY: [facility] LOCATION: [facility] PHYSICIAN:Marcina Kinnison H. Jayra Choyce, MD  OPERATIVE REPORT  DATE OF PROCEDURE:  02/03/2020  PREOPERATIVE DIAGNOSES:  Broken intra-articular hardware, right ankle.  POSTOPERATIVE DIAGNOSES:  Broken intraarticular hardware, right ankle.  PROCEDURE:   1.  Right ankle arthrotomy. 2.  Removal of deep implant, right tibia.  SURGEON:  Myrene Galas, MD  ASSISTANT:  PA student  ANESTHESIA:  General.  ESTIMATED BLOOD LOSS:  200 mL.  DRAINS:  None.  SPECIMENS:  None.  DISPOSITION:  To PACU.  CONDITION:  Stable.  BRIEF SUMMARY FOR PROCEDURE:  The patient is a 54 year old who has past medical history notable for heroin abuse and current compliance with daily methadone clinic who sustained a fracture dislocation of the ankle, then underwent subacute repair with  ORIF and supplemental tibiotalar calcaneal fixation because of concerns about noncompliance and the potential to re-dislocate.  The patient was indeed noncompliant and fractured both pins in such a way as they were prominent within the joint.  We  discussed with the patient the risks and benefits of removal including the possibility of anticipation of arthrotomy, possibility of ankle arthroscopy, and the probability of having to advance one of the pins into the tibia rather than being able to  withdraw it.  Chance of arthritis is markedly elevated because of the early noncompliance and the prominent hardware in addition to the initial injury.  We also discussed infection, nerve injury, vessel injury, DVT, PE, loss of motion, and others.  The  patient provided consent to proceed.  BRIEF SUMMARY OF PROCEDURE:  The patient was taken to the operating room where general anesthesia was induced.  I did remove his cast preoperatively, which showed no significant sign of regular wear on the heel, but was slightly soiled.  We  aggressively  washed with chlorhexidine soap and then Betadine scrub and paint.  Timeout was held.  The patient did receive preoperative antibiotics.  We began with bringing in the C-arm to confirm removal of the 10 fragments from the calcaneus and talus and  visualization of the tibial pins.  I remade the medial arthrotomy extending that proximally somewhat to improve visualization as well as distraction of the joint.  Once this was done, I used a Glorious Peach to identify the prominent K-wires and then grasped one  of them with a heavy duty needle driver.  With my assistant pulling the ankle into flexion, I was able to clear the talus and in stepwise fashion, withdraw the pin without undue injury to the talar cartilage.  Wound was irrigated.  I then was able to  palpate the remaining pin with a Glorious Peach, but because such a small portion of it was protruding, I was able to just barely grasp it, but not in a way sufficient to enable continuous control of the K-wire and withdraw it, but it was clearly quite prominent  and going to continue to produce and accumulate articular injury.  Consequently, decision was made to drive the pin in further.  Here a K-wire was advanced in an attempt to find the old tract, but this was unsuccessful and a 2.5 drill was used to  reestablish an appropriate track to be end-to-end with the protruding K-wire.  This was followed with a 3.5 drill and then I was able to turn the drill around and use its base as a tamp and advance the K-wire up into the tibia.  This was confirmed  through the  arthrotomy.  All wounds were copiously irrigated and closed in a layered fashion using 2-0 PDS and 2-0 nylon.  Sterile gently compressive dressing and posterior and stirrup splint were applied.  The patient was then awakened from anesthesia  and transported to PACU in stable condition.  Again an assistant was necessary to control the ankle position during extraction of the pin as well as during tamping in  of the other pin.  PROGNOSIS:  The patient will be strictly nonweightbearing for another 3 weeks, after which he will have protected weightbearing.  His risk of arthritis is markedly elevated given the initial injury as well as his noncompliance and subsequent articular  damage.  His continued methadone use also would be associated with increased potential for noncompliance as well.  We will see him back in the office in 2 weeks for removal of his splint and sutures.  CN/NUANCE  D:02/03/2020 T:02/03/2020 JOB:[id number]

## 2020-02-03 NOTE — Discharge Instructions (Signed)
Orthopaedic Trauma Service Discharge Instructions   General Discharge Instructions  Orthopaedic Injuries:  Right ankle fracture   WEIGHT BEARING STATUS: Nonweightbearing Right leg!!!!  RANGE OF MOTION/ACTIVITY: No ankle motion as you are splinted.   Bone health: continue with vitamin d supplements   Wound Care: DO NOT REMOVE SPLINT, DO NOT WALK ON SPLINT  Diet: as you were eating previously.  Can use over the counter stool softeners and bowel preparations, such as Miralax, to help with bowel movements.  Narcotics can be constipating.  Be sure to drink plenty of fluids  PAIN MEDICATION USE AND EXPECTATIONS  You have likely been given narcotic medications to help control your pain.  After a traumatic event that results in an fracture (broken bone) with or without surgery, it is ok to use narcotic pain medications to help control one's pain.  We understand that everyone responds to pain differently and each individual patient will be evaluated on a regular basis for the continued need for narcotic medications. Ideally, narcotic medication use should last no more than 6-8 weeks (coinciding with fracture healing).   As a patient it is your responsibility as well to monitor narcotic medication use and report the amount and frequency you use these medications when you come to your office visit.   We would also advise that if you are using narcotic medications, you should take a dose prior to therapy to maximize you participation.  IF YOU ARE ON NARCOTIC MEDICATIONS IT IS NOT PERMISSIBLE TO OPERATE A MOTOR VEHICLE (MOTORCYCLE/CAR/TRUCK/MOPED) OR HEAVY MACHINERY DO NOT MIX NARCOTICS WITH OTHER CNS (CENTRAL NERVOUS SYSTEM) DEPRESSANTS SUCH AS ALCOHOL   STOP SMOKING OR USING NICOTINE PRODUCTS!!!!  As discussed nicotine severely impairs your body's ability to heal surgical and traumatic wounds but also impairs bone healing.  Wounds and bone heal by forming microscopic blood vessels (angiogenesis)  and nicotine is a vasoconstrictor (essentially, shrinks blood vessels).  Therefore, if vasoconstriction occurs to these microscopic blood vessels they essentially disappear and are unable to deliver necessary nutrients to the healing tissue.  This is one modifiable factor that you can do to dramatically increase your chances of healing your injury.    (This means no smoking, no nicotine gum, patches, etc)  DO NOT USE NONSTEROIDAL ANTI-INFLAMMATORY DRUGS (NSAID'S)  Using products such as Advil (ibuprofen), Aleve (naproxen), Motrin (ibuprofen) for additional pain control during fracture healing can delay and/or prevent the healing response.  If you would like to take over the counter (OTC) medication, Tylenol (acetaminophen) is ok.  However, some narcotic medications that are given for pain control contain acetaminophen as well. Therefore, you should not exceed more than 4000 mg of tylenol in a day if you do not have liver disease.  Also note that there are may OTC medicines, such as cold medicines and allergy medicines that my contain tylenol as well.  If you have any questions about medications and/or interactions please ask your doctor/PA or your pharmacist.      ICE AND ELEVATE INJURED/OPERATIVE EXTREMITY  Using ice and elevating the injured extremity above your heart can help with swelling and pain control.  Icing in a pulsatile fashion, such as 20 minutes on and 20 minutes off, can be followed.    Do not place ice directly on skin. Make sure there is a barrier between to skin and the ice pack.    Using frozen items such as frozen peas works well as the conform nicely to the are that needs to be  iced.  USE AN ACE WRAP OR TED HOSE FOR SWELLING CONTROL  In addition to icing and elevation, Ace wraps or TED hose are used to help limit and resolve swelling.  It is recommended to use Ace wraps or TED hose until you are informed to stop.    When using Ace Wraps start the wrapping distally (farthest away  from the body) and wrap proximally (closer to the body)   Example: If you had surgery on your leg or thing and you do not have a splint on, start the ace wrap at the toes and work your way up to the thigh        If you had surgery on your upper extremity and do not have a splint on, start the ace wrap at your fingers and work your way up to the upper arm  IF YOU ARE IN A SPLINT OR CAST DO NOT REMOVE IT FOR ANY REASON   If your splint gets wet for any reason please contact the office immediately. You may shower in your splint or cast as long as you keep it dry.  This can be done by wrapping in a cast cover or garbage back (or similar)  Do Not stick any thing down your splint or cast such as pencils, money, or hangers to try and scratch yourself with.  If you feel itchy take benadryl as prescribed on the bottle for itching  IF YOU ARE IN A CAM BOOT (BLACK BOOT)  You may remove boot periodically. Perform daily dressing changes as noted below.  Wash the liner of the boot regularly and wear a sock when wearing the boot. It is recommended that you sleep in the boot until told otherwise    Call office for the following:  Temperature greater than 101F  Persistent nausea and vomiting  Severe uncontrolled pain  Redness, tenderness, or signs of infection (pain, swelling, redness, odor or green/yellow discharge around the site)  Difficulty breathing, headache or visual disturbances  Hives  Persistent dizziness or light-headedness  Extreme fatigue  Any other questions or concerns you may have after discharge  In an emergency, call 911 or go to an Emergency Department at a nearby hospital    CALL THE OFFICE WITH ANY QUESTIONS OR CONCERNS: [phone]   VISIT OUR WEBSITE FOR ADDITIONAL INFORMATION: [email or web address]

## 2020-02-03 NOTE — Progress Notes (Signed)
Orthopedic Tech Progress Note Patient Details:  Gene Gonzalez 07-05-1966 [id number] PACU RN called requesting a CAM WALKER BOOT. Spoke with MD he said patient would bring boot into office when it was time for a follow up. Left at bedside Ortho Devices Type of Ortho Device: CAM walker Ortho Device/Splint Location: RLE Ortho Device/Splint Interventions: Other (comment)   Post Interventions Patient Tolerated: Other (comment) Instructions Provided: Other (comment)   [name] 02/03/2020, 11:16 AM

## 2020-02-03 NOTE — Anesthesia Procedure Notes (Signed)
Procedure Name: Intubation Date/Time: 02/03/2020 8:24 AM Performed by: Carmela Rima, CRNA Pre-anesthesia Checklist: Patient identified, Emergency Drugs available, Suction available and Patient being monitored Patient Re-evaluated:Patient Re-evaluated prior to induction Oxygen Delivery Method: Circle System Utilized Preoxygenation: Pre-oxygenation with 100% oxygen Induction Type: IV induction Ventilation: Mask ventilation without difficulty Laryngoscope Size: Glidescope and 4 Grade View: Grade I Tube type: Oral Tube size: 7.5 mm Number of attempts: 1 Airway Equipment and Method: Stylet Placement Confirmation: ETT inserted through vocal cords under direct vision,  positive ETCO2 and breath sounds checked- equal and bilateral Secured at: 23 cm Tube secured with: Tape Dental Injury: Teeth and Oropharynx as per pre-operative assessment  Comments: Performed by Tressia Miners, SRNA under the supervision of CRNA and MDA

## 2020-02-03 NOTE — Anesthesia Preprocedure Evaluation (Signed)
Anesthesia Evaluation  Patient identified by MRN, date of birth, ID band Patient awake    Reviewed: Allergy & Precautions, NPO status , Patient's Chart, lab work & pertinent test results  History of Anesthesia Complications Negative for: history of anesthetic complications  Airway Mallampati: III  TM Distance: >3 FB Neck ROM: Full    Dental  (+) Teeth Intact, Dental Advisory Given   Pulmonary neg recent URI, Current Smoker and Patient abstained from smoking.,    breath sounds clear to auscultation       Cardiovascular negative cardio ROS   Rhythm:Regular Rate:Normal     Neuro/Psych PSYCHIATRIC DISORDERS Anxiety Depression negative neurological ROS     GI/Hepatic Neg liver ROS, GERD  Medicated and Controlled,  Endo/Other  Morbid obesity  Renal/GU negative Renal ROS     Musculoskeletal BROKEN HARDWARE RIGHT ANKLE   Abdominal (+) + obese,   Peds  Hematology negative hematology ROS (+)   Anesthesia Other Findings   Reproductive/Obstetrics                             Anesthesia Physical Anesthesia Plan  ASA: II  Anesthesia Plan: General   Post-op Pain Management:    Induction: Intravenous  PONV Risk Score and Plan: 1 and Ondansetron  Airway Management Planned:   Additional Equipment:   Intra-op Plan:   Post-operative Plan:   Informed Consent:   Plan Discussed with:   Anesthesia Plan Comments:         Anesthesia Quick Evaluation

## 2020-02-03 NOTE — Brief Op Note (Signed)
02/03/2020  10:45 AM  PATIENT:  Lelon Huh  54 y.o. male  PRE-OPERATIVE DIAGNOSIS:  BROKEN INTRA-ARTICULAR HARDWARE RIGHT ANKLE  POST-OPERATIVE DIAGNOSIS:  BROKEN INTRA-ARTICULAR HARDWARE RIGHT ANKLE  PROCEDURE:  Procedure(s): 1. RIGHT ANKLE ARTHROTOMY 2. REMOVAL DEEP IMPLANT RIGHT TIBIA  SURGEON:  Surgeon(s) and Role:    * Myrene Galas, MD - Primary  PHYSICIAN ASSISTANT: PA Student  ANESTHESIA:   general  EBL:  200 mL   BLOOD ADMINISTERED:none  DRAINS: none   LOCAL MEDICATIONS USED:  NONE  SPECIMEN:  No Specimen  DISPOSITION OF SPECIMEN:  N/A  COUNTS:  YES  TOURNIQUET:  * No tourniquets in log *  DICTATION: .Other Dictation: Dictation Number [id number]  PLAN OF CARE: Discharge to home after PACU  PATIENT DISPOSITION:  PACU - hemodynamically stable.   Delay start of Pharmacological VTE agent (>24hrs) due to surgical blood loss or risk of bleeding: no

## 2020-02-05 NOTE — Anesthesia Postprocedure Evaluation (Signed)
Anesthesia Post Note  Patient: Konrad Hoak  Procedure(s) Performed: HARDWARE REMOVAL ANKLE WITH ARTHROTOMY VS ARTHROSCOPY (Right )     Patient location during evaluation: PACU Anesthesia Type: General Level of consciousness: awake and alert Pain management: pain level controlled Vital Signs Assessment: post-procedure vital signs reviewed and stable Respiratory status: spontaneous breathing, nonlabored ventilation, respiratory function stable and patient connected to nasal cannula oxygen Cardiovascular status: blood pressure returned to baseline and stable Postop Assessment: no apparent nausea or vomiting Anesthetic complications: no    Last Vitals:  Vitals:   02/03/20 1140 02/03/20 1155  BP: 128/75   Pulse: 75 79  Resp: 13   Temp: (!) 36.3 C   SpO2: 93% 93%    Last Pain:  Vitals:   02/03/20 1140  TempSrc:   PainSc: 0-No pain                 Jahleel Stroschein

## 2020-07-05 ENCOUNTER — Emergency Department (HOSPITAL_COMMUNITY)
Admission: EM | Admit: 2020-07-05 | Discharge: 2020-07-06 | Disposition: A | Discharge status: Home / Self Care | Payer: Self-pay | Attending: Emergency Medicine | Admitting: Emergency Medicine

## 2020-07-05 ENCOUNTER — Other Ambulatory Visit: Payer: Self-pay

## 2020-07-05 ENCOUNTER — Encounter (HOSPITAL_COMMUNITY): Payer: Self-pay

## 2020-07-05 DIAGNOSIS — R112 Nausea with vomiting, unspecified: Secondary | ICD-10-CM | POA: Insufficient documentation

## 2020-07-05 DIAGNOSIS — R197 Diarrhea, unspecified: Secondary | ICD-10-CM | POA: Insufficient documentation

## 2020-07-05 DIAGNOSIS — F17210 Nicotine dependence, cigarettes, uncomplicated: Secondary | ICD-10-CM | POA: Insufficient documentation

## 2020-07-05 DIAGNOSIS — N390 Urinary tract infection, site not specified: Secondary | ICD-10-CM | POA: Insufficient documentation

## 2020-07-05 DIAGNOSIS — R1110 Vomiting, unspecified: Secondary | ICD-10-CM

## 2020-07-05 LAB — URINALYSIS, ROUTINE W REFLEX MICROSCOPIC
Glucose, UA: 100 mg/dL — AB
Ketones, ur: 40 mg/dL — AB
Leukocytes,Ua: NEGATIVE
Nitrite: POSITIVE — AB
Protein, ur: 100 mg/dL — AB
Specific Gravity, Urine: >1.03 — ABNORMAL HIGH (ref 1.005–1.030)
pH: 5.5 (ref 5.0–8.0)

## 2020-07-05 LAB — URINALYSIS, MICROSCOPIC (REFLEX)

## 2020-07-05 LAB — COMPREHENSIVE METABOLIC PANEL
ALT: 31 U/L (ref 0–44)
AST: 39 U/L (ref 15–41)
Albumin: 4.5 g/dL (ref 3.5–5.0)
Alkaline Phosphatase: 99 U/L (ref 38–126)
Anion gap: 18 — ABNORMAL HIGH (ref 5–15)
BUN: 13 mg/dL (ref 6–20)
CO2: 28 mmol/L (ref 22–32)
Calcium: 10.4 mg/dL — ABNORMAL HIGH (ref 8.9–10.3)
Chloride: 93 mmol/L — ABNORMAL LOW (ref 98–111)
Creatinine, Ser: 1.29 mg/dL — ABNORMAL HIGH (ref 0.61–1.24)
GFR, Estimated: >60 mL/min (ref >60)
Glucose, Bld: 139 mg/dL — ABNORMAL HIGH (ref 70–99)
Potassium: 4.1 mmol/L (ref 3.5–5.1)
Sodium: 139 mmol/L (ref 135–145)
Total Bilirubin: 0.7 mg/dL (ref 0.3–1.2)
Total Protein: 9.7 g/dL — ABNORMAL HIGH (ref 6.5–8.1)

## 2020-07-05 LAB — CBC
HCT: 55.3 % — ABNORMAL HIGH (ref 39.0–52.0)
Hemoglobin: 18.1 g/dL — ABNORMAL HIGH (ref 13.0–17.0)
MCH: 28.7 pg (ref 26.0–34.0)
MCHC: 32.7 g/dL (ref 30.0–36.0)
MCV: 87.6 fL (ref 80.0–100.0)
Platelets: 257 10*3/uL (ref 150–400)
RBC: 6.31 MIL/uL — ABNORMAL HIGH (ref 4.22–5.81)
RDW: 13.1 % (ref 11.5–15.5)
WBC: 9.1 10*3/uL (ref 4.0–10.5)
nRBC: 0 % (ref 0.0–0.2)

## 2020-07-05 LAB — LIPASE, BLOOD: Lipase: 32 U/L (ref 11–51)

## 2020-07-05 MED ORDER — SODIUM CHLORIDE 0.9 % IV SOLN
1.0000 g | Freq: Once | INTRAVENOUS | Status: DC
  Administered 2020-07-05: 1 g via INTRAVENOUS
  Filled 2020-07-05: qty 10

## 2020-07-05 MED ORDER — ONDANSETRON 4 MG PO TBDP
4.0000 mg | ORAL_TABLET | Freq: Once | ORAL | Status: AC | PRN
  Administered 2020-07-05: 4 mg via ORAL
  Filled 2020-07-05: qty 1

## 2020-07-05 MED ORDER — LACTATED RINGERS IV BOLUS
1000.0000 mL | Freq: Once | INTRAVENOUS | Status: AC
  Administered 2020-07-06: 1000 mL via INTRAVENOUS

## 2020-07-05 NOTE — ED Triage Notes (Signed)
Pt reports 3 days of abd pain, emesis and diarrhea. # episodes of vomiting today.

## 2020-07-05 NOTE — ED Provider Notes (Signed)
[facility] Provider Note   CSN: [id number] Arrival date & time: 07/05/20  1225     History Chief Complaint  Patient presents with  . Abdominal Pain  . Emesis  . Diarrhea    Lelon HuhJared Seibert is a 54 y.o. male.  The history is provided by the patient.  Abdominal Pain Pain location:  Generalized Pain quality: not aching   Pain radiates to:  Does not radiate Pain severity:  Moderate Onset quality:  Gradual Duration:  3 days Timing:  Intermittent Progression:  Waxing and waning Chronicity:  New Relieved by:  Nothing Worsened by:  Nothing Ineffective treatments:  None tried Associated symptoms: diarrhea, fever, nausea and vomiting   Associated symptoms: no chest pain, no chills, no cough, no dysuria and no shortness of breath   Emesis Associated symptoms: abdominal pain, diarrhea and fever   Associated symptoms: no arthralgias, no chills, no cough and no headaches   Diarrhea Associated symptoms: abdominal pain, fever and vomiting   Associated symptoms: no arthralgias, no chills and no headaches        Past Medical History:  Diagnosis Date  . Anxiety   . Closed dislocation of right ankle 01/09/2020  . Depression   . GERD (gastroesophageal reflux disease)   . Heart murmur    as a child, never has caused any problems  . History of heroin abuse (HCC)   . Methadone maintenance therapy patient (HCC)   . Syndesmotic disruption of ankle, right, initial encounter 01/09/2020  . Vitamin D deficiency 01/09/2020    Patient Active Problem List   Diagnosis Date Noted  . Vitamin D deficiency 01/09/2020  . Syndesmotic disruption of ankle, right, initial encounter 01/09/2020  . Closed dislocation of right ankle 01/09/2020  . Depression   . History of heroin abuse (HCC)   . Methadone maintenance therapy patient (HCC)   . Closed displaced trimalleolar fracture of right ankle 01/08/2020    Past Surgical History:  Procedure Laterality Date  .  HARDWARE REMOVAL Right 02/03/2020   Procedure: HARDWARE REMOVAL ANKLE WITH ARTHROTOMY VS ARTHROSCOPY;  Surgeon: Myrene GalasHandy, Michael, MD;  [facility];  Service: Orthopedics;  Laterality: Right;  . ORIF ANKLE FRACTURE Right 01/08/2020   Procedure: OPEN REDUCTION INTERNAL FIXATION (ORIF) RIGHT ANKLE FRACTURE;  Surgeon: Myrene GalasHandy, Michael, MD;  Location: [facility] OR;  Service: Orthopedics;  Laterality: Right;       No family history on file.  Social History   Tobacco Use  . Smoking status: Current Every Day Smoker    Packs/day: 0.50    Types: Cigarettes  . Smokeless tobacco: Never Used  Vaping Use  . Vaping Use: Never used  Substance Use Topics  . Alcohol use: No  . Drug use: Not Currently    Home Medications Prior to Admission medications   Medication Sig Start Date End Date Taking? Authorizing Provider  acetaminophen (TYLENOL) 500 MG tablet Take 2 tablets (1,000 mg total) by mouth every 6 (six) hours as needed for mild pain or moderate pain. 02/03/20   Montez MoritaPaul, Keith, PA-C  atorvastatin (LIPITOR) 10 MG tablet Take 10 mg by mouth at bedtime. 12/09/19   [provider]  cefpodoxime (VANTIN) 200 MG tablet Take 1 tablet (200 mg total) by mouth 2 (two) times daily for 10 days. 07/06/20 07/16/20  Sabino DonovanKatz, Caetano Oberhaus C, MD  citalopram (CELEXA) 40 MG tablet Take 40 mg by mouth daily.    [provider]  dimenhyDRINATE (DRAMAMINE) 50 MG tablet Take 50 mg by  mouth every 8 (eight) hours as needed for nausea.    [provider]  docusate sodium (COLACE) 100 MG capsule Take 100 mg by mouth daily as needed for mild constipation.    [provider]  doxepin (SINEQUAN) 75 MG capsule Take 75 mg by mouth at bedtime.  12/09/19   [provider]  ketorolac (TORADOL) 10 MG tablet Take 1 tablet (10 mg total) by mouth every 6 (six) hours as needed for moderate pain. 02/03/20   Montez Morita, PA-C  methadone (DOLOPHINE) 10 MG/5ML solution Take 88 mg by mouth every morning.     [provider]  methocarbamol (ROBAXIN) 500 MG tablet Take 1-2 tablets (500-1,000 mg total) by mouth every 8 (eight) hours as needed for muscle spasms. 01/09/20   Montez Morita, PA-C  ondansetron (ZOFRAN ODT) 4 MG disintegrating tablet Take 1 tablet (4 mg total) by mouth every 8 (eight) hours as needed for up to 10 doses for nausea or vomiting. 07/06/20   Sabino Donovan, MD  pantoprazole (PROTONIX) 20 MG tablet Take 20 mg by mouth daily. 11/05/19   [provider]  polyethylene glycol (MIRALAX / GLYCOLAX) packet Take 17 g by mouth daily as needed for mild constipation.     [provider]  pramipexole (MIRAPEX) 0.125 MG tablet Take 0.125 mg by mouth at bedtime. 12/09/19   [provider]  venlafaxine XR (EFFEXOR-XR) 150 MG 24 hr capsule Take 150 mg by mouth daily. 12/09/19   [provider]  Vitamin D, Ergocalciferol, (DRISDOL) 1.25 MG (50000 UNIT) CAPS capsule Take 50,000 Units by mouth once a week. Monday 12/09/19   [provider]    Allergies    Ciprofloxacin  Review of Systems   Review of Systems  Constitutional: Positive for fever. Negative for chills.  HENT: Negative for congestion and rhinorrhea.   Respiratory: Negative for cough and shortness of breath.   Cardiovascular: Negative for chest pain and palpitations.  Gastrointestinal: Positive for abdominal pain, diarrhea, nausea and vomiting.  Genitourinary: Negative for difficulty urinating and dysuria.  Musculoskeletal: Negative for arthralgias and back pain.  Skin: Negative for color change and rash.  Neurological: Negative for light-headedness and headaches.    Physical Exam Updated Vital Signs BP 108/79   Pulse 69   Temp 98.4 F (36.9 C) (Oral)   Resp 16   Ht 5\' 9"  (1.753 m)   Wt 90.7 kg   SpO2 94%   BMI 29.53 kg/m   Physical Exam Vitals and nursing note reviewed.  Constitutional:      Appearance: He is well-developed.  HENT:     Head: Normocephalic and atraumatic.  Eyes:      Conjunctiva/sclera: Conjunctivae normal.  Cardiovascular:     Rate and Rhythm: Normal rate and regular rhythm.     Heart sounds: No murmur heard.   Pulmonary:     Effort: Pulmonary effort is normal. No respiratory distress.     Breath sounds: Normal breath sounds.  Abdominal:     Palpations: Abdomen is soft.     Tenderness: There is no abdominal tenderness. There is no right CVA tenderness, left CVA tenderness, guarding or rebound. Negative signs include Murphy's sign, Rovsing's sign, McBurney's sign and psoas sign.     Hernia: No hernia is present.  Genitourinary:    Comments: Deferred by pt Musculoskeletal:     Cervical back: Neck supple.  Skin:    General: Skin is warm and dry.     Capillary Refill: Capillary  refill takes less than 2 seconds.  Neurological:     Mental Status: He is alert.     ED Results / Procedures / Treatments   Labs (all labs ordered are listed, but only abnormal results are displayed) Labs Reviewed  COMPREHENSIVE METABOLIC PANEL - Abnormal; Notable for the following components:      Result Value   Chloride 93 (*)    Glucose, Bld 139 (*)    Creatinine, Ser 1.29 (*)    Calcium 10.4 (*)    Total Protein 9.7 (*)    Anion gap 18 (*)    All other components within normal limits  CBC - Abnormal; Notable for the following components:   RBC 6.31 (*)    Hemoglobin 18.1 (*)    HCT 55.3 (*)    All other components within normal limits  URINALYSIS, ROUTINE W REFLEX MICROSCOPIC - Abnormal; Notable for the following components:   Color, Urine ORANGE (*)    APPearance HAZY (*)    Specific Gravity, Urine >1.030 (*)    Glucose, UA 100 (*)    Hgb urine dipstick TRACE (*)    Bilirubin Urine LARGE (*)    Ketones, ur 40 (*)    Protein, ur 100 (*)    Nitrite POSITIVE (*)    All other components within normal limits  URINALYSIS, MICROSCOPIC (REFLEX) - Abnormal; Notable for the following components:   Bacteria, UA MANY (*)    All other components within normal  limits  LIPASE, BLOOD    EKG None  Radiology No results found.  Procedures Procedures (including critical care time)  Medications Ordered in ED Medications  ondansetron (ZOFRAN-ODT) disintegrating tablet 4 mg (4 mg Oral Given 07/05/20 1255)  lactated ringers bolus 1,000 mL (1,000 mLs Intravenous New Bag/Given 07/06/20 0035)  ciprofloxacin (CIPRO) IVPB 400 mg (0 mg Intravenous Stopped 07/06/20 0035)    ED Course  I have reviewed the triage vital signs and the nursing notes.  Pertinent labs & imaging results that were available during my care of the patient were reviewed by me and considered in my medical decision making (see chart for details).    MDM Rules/Calculators/A&P                          Patient had diffuse abdominal pain.  Urinary frequency and urgency.  But no burning no discharge.  Urinalysis does show concerns for UTI he will be treated with antibiotics.  Blood pressures are low however he is in a poor position for an accurate blood pressure in the bed.  I have nursing repositioning him.  He will get a fluid bolus as well.  He has been tolerating p.o.  Having some loose stools.  Has a nonperitoneal ache abdomen.  Is afebrile.  Overall is well-appearing will likely be safe for discharge.  Patient had reaction to fluoroquinolone, Rocephin given.  Patient's blood pressure is stable.  He is tolerating p.o. and feels comfortable discharge.  Antibiotics prescribed antiemetics prescribed.  Strict return precautions outpatient follow-up recommended  Final Clinical Impression(s) / ED Diagnoses Final diagnoses:  Vomiting in adult  Urinary tract infection without hematuria, site unspecified    Rx / DC Orders ED Discharge Orders         Ordered    ondansetron (ZOFRAN ODT) 4 MG disintegrating tablet  Every 8 hours PRN        07/06/20 0125    cefpodoxime (VANTIN) 200 MG tablet  2 times  daily        07/06/20 0125           Sabino Donovan, MD 07/06/20 [phone]

## 2020-07-06 MED ORDER — ONDANSETRON 4 MG PO TBDP
4.0000 mg | ORAL_TABLET | Freq: Three times a day (TID) | ORAL | 0 refills | Status: AC | PRN
Start: 2020-07-06 — End: ?

## 2020-07-06 MED ORDER — CIPROFLOXACIN IN D5W 400 MG/200ML IV SOLN
400.0000 mg | Freq: Once | INTRAVENOUS | Status: AC
  Administered 2020-07-06: 400 mg via INTRAVENOUS
  Filled 2020-07-06: qty 200

## 2020-07-06 MED ORDER — CEFPODOXIME PROXETIL 200 MG PO TABS: 200.0000 mg | ORAL_TABLET | Freq: Two times a day (BID) | ORAL | 0 refills | Status: AC

## 2020-08-25 ENCOUNTER — Emergency Department (HOSPITAL_COMMUNITY)
Admission: EM | Admit: 2020-08-25 | Discharge: 2020-08-26 | Disposition: A | Discharge status: Home / Self Care | Payer: Self-pay | Attending: Emergency Medicine | Admitting: Emergency Medicine

## 2020-08-25 ENCOUNTER — Emergency Department (HOSPITAL_COMMUNITY): Payer: Self-pay

## 2020-08-25 ENCOUNTER — Other Ambulatory Visit: Payer: Self-pay

## 2020-08-25 DIAGNOSIS — T50901A Poisoning by unspecified drugs, medicaments and biological substances, accidental (unintentional), initial encounter: Secondary | ICD-10-CM

## 2020-08-25 DIAGNOSIS — Z20822 Contact with and (suspected) exposure to covid-19: Secondary | ICD-10-CM | POA: Insufficient documentation

## 2020-08-25 DIAGNOSIS — T424X1A Poisoning by benzodiazepines, accidental (unintentional), initial encounter: Secondary | ICD-10-CM | POA: Insufficient documentation

## 2020-08-25 DIAGNOSIS — F17210 Nicotine dependence, cigarettes, uncomplicated: Secondary | ICD-10-CM | POA: Insufficient documentation

## 2020-08-25 LAB — RAPID URINE DRUG SCREEN, HOSP PERFORMED
Amphetamines: NOT DETECTED
Barbiturates: NOT DETECTED
Benzodiazepines: POSITIVE — AB
Cocaine: POSITIVE — AB
Opiates: NOT DETECTED
Tetrahydrocannabinol: NOT DETECTED

## 2020-08-25 LAB — CBC WITH DIFFERENTIAL/PLATELET
Abs Immature Granulocytes: 0.03 10*3/uL (ref 0.00–0.07)
Basophils Absolute: 0 10*3/uL (ref 0.0–0.1)
Basophils Relative: 0 %
Eosinophils Absolute: 0.4 10*3/uL (ref 0.0–0.5)
Eosinophils Relative: 4 %
HCT: 43.5 % (ref 39.0–52.0)
Hemoglobin: 13.9 g/dL (ref 13.0–17.0)
Immature Granulocytes: 0 %
Lymphocytes Relative: 52 %
Lymphs Abs: 4.8 10*3/uL — ABNORMAL HIGH (ref 0.7–4.0)
MCH: 29 pg (ref 26.0–34.0)
MCHC: 32 g/dL (ref 30.0–36.0)
MCV: 90.6 fL (ref 80.0–100.0)
Monocytes Absolute: 0.7 10*3/uL (ref 0.1–1.0)
Monocytes Relative: 7 %
Neutro Abs: 3.4 10*3/uL (ref 1.7–7.7)
Neutrophils Relative %: 37 %
Platelets: 220 10*3/uL (ref 150–400)
RBC: 4.8 MIL/uL (ref 4.22–5.81)
RDW: 13.2 % (ref 11.5–15.5)
WBC: 9.3 10*3/uL (ref 4.0–10.5)
nRBC: 0 % (ref 0.0–0.2)

## 2020-08-25 LAB — BASIC METABOLIC PANEL
Anion gap: 11 (ref 5–15)
BUN: 8 mg/dL (ref 6–20)
CO2: 27 mmol/L (ref 22–32)
Calcium: 9.2 mg/dL (ref 8.9–10.3)
Chloride: 102 mmol/L (ref 98–111)
Creatinine, Ser: 0.85 mg/dL (ref 0.61–1.24)
GFR, Estimated: >60 mL/min (ref >60)
Glucose, Bld: 98 mg/dL (ref 70–99)
Potassium: 3.3 mmol/L — ABNORMAL LOW (ref 3.5–5.1)
Sodium: 140 mmol/L (ref 135–145)

## 2020-08-25 LAB — MAGNESIUM: Magnesium: 1.7 mg/dL (ref 1.7–2.4)

## 2020-08-25 LAB — ETHANOL: Alcohol, Ethyl (B): <10 mg/dL (ref <10)

## 2020-08-25 LAB — RESP PANEL BY RT-PCR (FLU A&B, COVID) ARPGX2
Influenza A by PCR: NEGATIVE
Influenza B by PCR: NEGATIVE
SARS Coronavirus 2 by RT PCR: NEGATIVE

## 2020-08-25 LAB — TROPONIN I (HIGH SENSITIVITY): Troponin I (High Sensitivity): 2 ng/L (ref <18)

## 2020-08-25 MED ORDER — SODIUM CHLORIDE 0.9 % IV BOLUS
1000.0000 mL | Freq: Once | INTRAVENOUS | Status: AC
  Administered 2020-08-25: 1000 mL via INTRAVENOUS

## 2020-08-25 MED ORDER — NALOXONE HCL 2 MG/2ML IJ SOSY: 1.0000 mg | PREFILLED_SYRINGE | Freq: Once | INTRAMUSCULAR | Status: DC

## 2020-08-25 MED ORDER — SODIUM CHLORIDE 0.9 % IV BOLUS
1000.0000 mL | Freq: Once | INTRAVENOUS | Status: AC
  Administered 2020-08-25: 17:00:00 1000 mL via INTRAVENOUS

## 2020-08-25 MED ORDER — NALOXONE HCL 0.4 MG/ML IJ SOLN
0.4000 mg | Freq: Once | INTRAMUSCULAR | Status: AC
  Administered 2020-08-25: 20:00:00 0.4 mg via INTRAVENOUS
  Filled 2020-08-25: qty 1

## 2020-08-25 NOTE — ED Triage Notes (Signed)
Pt arrives to ED BIB [facility] due to a Drug Overdose. Per EMS pt overdosed on Cocaine, Xanax and possible Heroin with substance abuse Hx of same. Per EMS upon their arrival pt was GCS of 3, Hypotensive and 0 Radial Pulse. 1mg  of Narcan IN and NS was administered by EMS. Pt a/o x1 (to self) at the moment. EDP at bedside upon pt's arrival.  BP 60 Systolic HR 80 R 6 O2 98% RA CBG 111

## 2020-08-25 NOTE — Discharge Instructions (Addendum)
You were seen in the emergency department after suspected drug overdose.  We did find cocaine and benzodiazepine (which include xanax) in your urine, but I am concerned what he used to have been laced with other substances, including opioids, like fentanyl or heroin.  This was a very dangerous situation.  You could have died from this.  Thankfully, your medical work-up is reassuring.  We did not find any other serious emergencies on the scan of your brain or in your blood test.  We felt that it was safe to discharge you once your sobriety had improved.    I would very strongly recommend that you avoid ever using street drugs again.  This is a leading cause of drug overdose around the country.

## 2020-08-25 NOTE — ED Provider Notes (Signed)
[facility] Provider Note   CSN: [id number] Arrival date & time: 08/25/20  1534     History Chief Complaint  Patient presents with  . Drug Overdose    Gene Gonzalez is a 54 y.o. male w/ hx of polysubstance use presented to emerge department altered mental status.  EMS reports were called out to the scene by the patient's friend, reports that he and the patient been using drugs today.  The patient himself reportedly had taken "a couple of Xanax and some cocaine".  EMS reports the patient with diminished GCS on scene.  He did appear to be hypotensive initially.  They noted he had pinpoint pupils and was hypopneic with a respiratory rate of 6, and given 1 mg of Narcan, with immediate improvement of his respiratory rate, and subsequent mydriasis and yawning.  EMS also gave the patient 250 cc of NS IV fluids.  Patient is still somnolent but arousable on arrival.  He denies to me any fentanyl or opioid use today.  HPI     Past Medical History:  Diagnosis Date  . Anxiety   . Closed dislocation of right ankle 01/09/2020  . Depression   . GERD (gastroesophageal reflux disease)   . Heart murmur    as a child, never has caused any problems  . History of heroin abuse (HCC)   . Methadone maintenance therapy patient (HCC)   . Syndesmotic disruption of ankle, right, initial encounter 01/09/2020  . Vitamin D deficiency 01/09/2020    Patient Active Problem List   Diagnosis Date Noted  . Vitamin D deficiency 01/09/2020  . Syndesmotic disruption of ankle, right, initial encounter 01/09/2020  . Closed dislocation of right ankle 01/09/2020  . Depression   . History of heroin abuse (HCC)   . Methadone maintenance therapy patient (HCC)   . Closed displaced trimalleolar fracture of right ankle 01/08/2020    Past Surgical History:  Procedure Laterality Date  . HARDWARE REMOVAL Right 02/03/2020   Procedure: HARDWARE REMOVAL ANKLE WITH ARTHROTOMY VS ARTHROSCOPY;   Surgeon: Myrene GalasHandy, Michael, MD;  [facility];  Service: Orthopedics;  Laterality: Right;  . ORIF ANKLE FRACTURE Right 01/08/2020   Procedure: OPEN REDUCTION INTERNAL FIXATION (ORIF) RIGHT ANKLE FRACTURE;  Surgeon: Myrene GalasHandy, Michael, MD;  Location: [facility] OR;  Service: Orthopedics;  Laterality: Right;       No family history on file.  Social History   Tobacco Use  . Smoking status: Current Every Day Smoker    Packs/day: 0.50    Types: Cigarettes  . Smokeless tobacco: Never Used  Vaping Use  . Vaping Use: Never used  Substance Use Topics  . Alcohol use: No  . Drug use: Not Currently    Home Medications Prior to Admission medications   Medication Sig Start Date End Date Taking? Authorizing Provider  acetaminophen (TYLENOL) 500 MG tablet Take 2 tablets (1,000 mg total) by mouth every 6 (six) hours as needed for mild pain or moderate pain. 02/03/20   Montez MoritaPaul, Keith, PA-C  atorvastatin (LIPITOR) 10 MG tablet Take 10 mg by mouth at bedtime. 12/09/19   [provider]  citalopram (CELEXA) 40 MG tablet Take 40 mg by mouth daily.    [provider]  dimenhyDRINATE (DRAMAMINE) 50 MG tablet Take 50 mg by mouth every 8 (eight) hours as needed for nausea.    [provider]  docusate sodium (COLACE) 100 MG capsule Take 100 mg by mouth daily as needed for mild constipation.  [provider]  doxepin (SINEQUAN) 75 MG capsule Take 75 mg by mouth at bedtime.  12/09/19   [provider]  ketorolac (TORADOL) 10 MG tablet Take 1 tablet (10 mg total) by mouth every 6 (six) hours as needed for moderate pain. 02/03/20   Montez Morita, PA-C  methadone (DOLOPHINE) 10 MG/5ML solution Take 88 mg by mouth every morning.     [provider]  methocarbamol (ROBAXIN) 500 MG tablet Take 1-2 tablets (500-1,000 mg total) by mouth every 8 (eight) hours as needed for muscle spasms. 01/09/20   Montez Morita, PA-C  ondansetron (ZOFRAN ODT) 4 MG disintegrating tablet Take 1 tablet (4 mg  total) by mouth every 8 (eight) hours as needed for up to 10 doses for nausea or vomiting. 07/06/20   Sabino Donovan, MD  pantoprazole (PROTONIX) 20 MG tablet Take 20 mg by mouth daily. 11/05/19   [provider]  polyethylene glycol (MIRALAX / GLYCOLAX) packet Take 17 g by mouth daily as needed for mild constipation.     [provider]  pramipexole (MIRAPEX) 0.125 MG tablet Take 0.125 mg by mouth at bedtime. 12/09/19   [provider]  venlafaxine XR (EFFEXOR-XR) 150 MG 24 hr capsule Take 150 mg by mouth daily. 12/09/19   [provider]  Vitamin D, Ergocalciferol, (DRISDOL) 1.25 MG (50000 UNIT) CAPS capsule Take 50,000 Units by mouth once a week. Monday 12/09/19   [provider]    Allergies    Smallpox vaccine and Ciprofloxacin  Review of Systems   Review of Systems  Unable to perform ROS: Mental status change (level 5 caveat)    Physical Exam Updated Vital Signs BP 113/86   Pulse (!) 56   Temp (!) 97.5 F (36.4 C) (Oral)   Resp 12   SpO2 95%   Physical Exam Vitals and nursing note reviewed.  Constitutional:      Appearance: He is well-developed and well-nourished. He is diaphoretic.     Comments: Yawning repeatedly  HENT:     Head: Normocephalic and atraumatic.  Eyes:     Conjunctiva/sclera: Conjunctivae normal.     Comments: Dilated pupils bilaterally  Cardiovascular:     Rate and Rhythm: Normal rate and regular rhythm.     Pulses: Normal pulses.  Pulmonary:     Effort: Pulmonary effort is normal. No respiratory distress.  Abdominal:     Palpations: Abdomen is soft.     Tenderness: There is no abdominal tenderness.  Musculoskeletal:        General: No edema.     Cervical back: Neck supple.  Skin:    General: Skin is warm.  Neurological:     General: No focal deficit present.     Mental Status: He is alert and oriented to person, place, and time.  Psychiatric:        Mood and Affect: Mood and affect normal.     ED Results  / Procedures / Treatments   Labs (all labs ordered are listed, but only abnormal results are displayed) Labs Reviewed  BASIC METABOLIC PANEL - Abnormal; Notable for the following components:      Result Value   Potassium 3.3 (*)    All other components within normal limits  CBC WITH DIFFERENTIAL/PLATELET - Abnormal; Notable for the following components:   Lymphs Abs 4.8 (*)    All other components within normal limits  RAPID URINE DRUG SCREEN, HOSP PERFORMED - Abnormal; Notable for the following components:   Cocaine  POSITIVE (*)    Benzodiazepines POSITIVE (*)    All other components within normal limits  RESP PANEL BY RT-PCR (FLU A&B, COVID) ARPGX2  MAGNESIUM  ETHANOL  TROPONIN I (HIGH SENSITIVITY)    EKG EKG Interpretation  Date/Time:  Wednesday August 25 2020 16:25:01 EST Ventricular Rate:  68 PR Interval:    QRS Duration: 112 QT Interval:  507 QTC Calculation: 540 R Axis:   34 Text Interpretation: Sinus rhythm Borderline intraventricular conduction delay RSR' in V1 or V2, probably normal variant Borderline T abnormalities, anterior leads Prolonged QT interval No STEMI Confirmed by Alvester Chou [phone]) on 08/25/2020 4:38:03 PM   Radiology CT Head Wo Contrast  Result Date: 08/25/2020 CLINICAL DATA:  Mental status change, drug overdose EXAM: CT HEAD WITHOUT CONTRAST TECHNIQUE: Contiguous axial images were obtained from the base of the skull through the vertex without intravenous contrast. COMPARISON:  None. FINDINGS: Brain: No acute infarct or hemorrhage. Lateral ventricles and midline structures are unremarkable. No acute extra-axial fluid collections. No mass effect. Vascular: No hyperdense vessel or unexpected calcification. Skull: Normal. Negative for fracture or focal lesion. Sinuses/Orbits: Mild mucosal thickening left ethmoid air cells. Remaining sinuses are clear. Other: None. IMPRESSION: 1. No acute intracranial process. Electronically Signed   By: Sharlet Salina  M.D.   On: 08/25/2020 18:04   DG Chest Portable 1 View  Result Date: 08/25/2020 CLINICAL DATA:  Near syncopal episode. Proximal drug overdose and aspiration. EXAM: PORTABLE CHEST 1 VIEW COMPARISON:  None. FINDINGS: The heart size and mediastinal contours are within normal limits. Low lung volumes are noted, however both lungs are clear. The visualized skeletal structures are unremarkable. IMPRESSION: Low lung volumes. No active disease. Electronically Signed   By: Danae Orleans M.D.   On: 08/25/2020 17:42    Procedures .Critical Care Performed by: Terald Sleeper, MD Authorized by: Terald Sleeper, MD   Critical care provider statement:    Critical care time (minutes):  35   Critical care was necessary to treat or prevent imminent or life-threatening deterioration of the following conditions:  Circulatory failure   Critical care was time spent personally by me on the following activities:  Discussions with consultants, evaluation of patient's response to treatment, examination of patient, ordering and performing treatments and interventions, ordering and review of laboratory studies, ordering and review of radiographic studies, pulse oximetry, re-evaluation of patient's condition, obtaining history from patient or surrogate and review of old charts Comments:     Drug overdose, hypotension, requiring IV fluids, frequent bedside reassessment   (including critical care time)  Medications Ordered in ED Medications  sodium chloride 0.9 % bolus 1,000 mL (0 mLs Intravenous Stopped 08/25/20 1758)  naloxone (NARCAN) injection 0.4 mg (0.4 mg Intravenous Given 08/25/20 1935)  sodium chloride 0.9 % bolus 1,000 mL (0 mLs Intravenous Stopped 08/25/20 1758)    ED Course  I have reviewed the triage vital signs and the nursing notes.  Pertinent labs & imaging results that were available during my care of the patient were reviewed by me and considered in my medical decision making (see chart for  details).  This patient presents with concern for acute AMS.  This involves an extensive number of treatment options, and is a complaint that carries with it a high risk of complications and morbidity.  The differential diagnosis includes drug overdose vs ICH vs metabolic encephalopathy vs anemia vs other  The clinical history on arrival is strongly suggestive of drug overdose, as his friend who called  911 witnessed the patient using multiple drugs today.  He reports xanax and cocaine use, but I suspect this may have been mixed with opioids, given his clinical picture.  Narcan given in the field.  I ordered, reviewed, and interpreted labs, which included UDS positive for cocaine, benzos, ethanol negative, Covid/flu negative, CBC and BMP largely unremarkable I ordered medication IV fluids for hypotension I ordered imaging studies which included CTH and DG chest  I independently visualized and interpreted imaging which showed no focal lung findings or evidence of aspiration, no ICH, and the monitor tracing which showed sinus rhythm   Here in the ED he experienced some transient hypotension that  resolved with IV fluids.  I have a lower suspicion for sepsis or cardiogenic shock with this clinical presentation.   Clinical Course as of 08/26/20 1010  Wed Aug 25, 2020  1700 Informed by nursing that patient's BP down 80-90 systolic, I've instructed his nurse to give a 2 L NS bolus and reassess, he's received only 300-400 cc fluids total so far [MT]  1707 Pupils once again pinpoint, RR 11, BP now 95 systolic, HR 70's.  Advised nursing to give another 0.4 mg IV narcan as I suspect he is having some rebound opioid overdose symptoms.  He is 96% on 4L [place] [MT]  1839  IMPRESSION: 1. No acute intracranial process.  [MT]  2042 On reassessment awakens readily to stimulation but immediately falls back asleep.  UDS positive for cocaine and benzos.  Vitals and breathing stable.  I advised nursing to give 1 mg IV  narcan and will reassess [MT]  2227 Patient more awake now, tells me again he did xanax "I don't know how much, someone gave it to me" and snorted cocaine, but does not think it was laced with anything, but is unsure.  I suspect it was likely mixed with fentanyl or an opioid as he responded several times and quickly to narcan, with pinpoint pupils initially.  Vitals stable now.  Weaned back to room air.  BP stable.  Still needs a bit of monitoring to improve his sobriety.  Once he is ambulating steadily and eating, he can be discharged. [MT]    Clinical Course User Index [MT] Loree Shehata, Kermit Balo, MD    Final Clinical Impression(s) / ED Diagnoses Final diagnoses:  Accidental drug overdose, initial encounter    Rx / DC Orders ED Discharge Orders    None       Terald Sleeper, MD 08/26/20 1011

## 2020-08-25 NOTE — ED Notes (Signed)
Pt more responsive after narcan. Pt now alert.

## 2020-08-25 NOTE — ED Notes (Signed)
Patient transported to CT 

## 2020-08-25 NOTE — ED Notes (Signed)
Pt BP dropped to 80 systolic. Renaye Rakers, MD notified. VRBO to hang another 1L [place] NS. Renaye Rakers, MD at bedside.

## 2020-08-26 NOTE — ED Provider Notes (Signed)
7:09 AM Assumed care from Dr. Renaye Rakers, please see their note for full history, physical and decision making until this point. In brief this is a 54 y.o. year old male who presented to the ED tonight with Drug Overdose     OD on multiple drugs requiring narcan. Pending reeval for metabolization of drugs.   Eating. Drinking. Walking. Using BR. Stable for discharge.   Discharge instructions, including strict return precautions for new or worsening symptoms, given. Patient and/or family verbalized understanding and agreement with the plan as described.   Labs, studies and imaging reviewed by myself and considered in medical decision making if ordered. Imaging interpreted by radiology.  Labs Reviewed  BASIC METABOLIC PANEL - Abnormal; Notable for the following components:      Result Value   Potassium 3.3 (*)    All other components within normal limits  CBC WITH DIFFERENTIAL/PLATELET - Abnormal; Notable for the following components:   Lymphs Abs 4.8 (*)    All other components within normal limits  RAPID URINE DRUG SCREEN, HOSP PERFORMED - Abnormal; Notable for the following components:   Cocaine POSITIVE (*)    Benzodiazepines POSITIVE (*)    All other components within normal limits  RESP PANEL BY RT-PCR (FLU A&B, COVID) ARPGX2  MAGNESIUM  ETHANOL  TROPONIN I (HIGH SENSITIVITY)    CT Head Wo Contrast  Final Result    DG Chest Portable 1 View  Final Result      No follow-ups on file.    Marily Memos, MD 08/26/20 [phone]

## 2020-08-26 NOTE — ED Notes (Signed)
Patient verbalizes understanding of discharge instructions. Opportunity for questioning and answers were provided. Armband removed by staff, pt discharged from ED ambulatory to lobby.  

## 2020-08-26 NOTE — ED Notes (Signed)
Pt ambulated to restroom independently with no complaints  

## 2020-08-26 NOTE — ED Notes (Signed)
Pt given [place] sandwich and drink. Pt is more alert and oriented.

## 2021-03-23 ENCOUNTER — Emergency Department (HOSPITAL_COMMUNITY)
Admission: EM | Admit: 2021-03-23 | Discharge: 2021-03-23 | Disposition: A | Discharge status: Home / Self Care | Payer: Self-pay | Attending: Emergency Medicine | Admitting: Emergency Medicine

## 2021-03-23 ENCOUNTER — Encounter (HOSPITAL_COMMUNITY): Payer: Self-pay | Admitting: Emergency Medicine

## 2021-03-23 ENCOUNTER — Emergency Department (HOSPITAL_COMMUNITY): Payer: Self-pay

## 2021-03-23 ENCOUNTER — Other Ambulatory Visit: Payer: Self-pay

## 2021-03-23 DIAGNOSIS — T40601A Poisoning by unspecified narcotics, accidental (unintentional), initial encounter: Secondary | ICD-10-CM

## 2021-03-23 DIAGNOSIS — Y900 Blood alcohol level of less than 20 mg/100 ml: Secondary | ICD-10-CM | POA: Insufficient documentation

## 2021-03-23 DIAGNOSIS — T402X1A Poisoning by other opioids, accidental (unintentional), initial encounter: Secondary | ICD-10-CM | POA: Insufficient documentation

## 2021-03-23 DIAGNOSIS — F17210 Nicotine dependence, cigarettes, uncomplicated: Secondary | ICD-10-CM | POA: Insufficient documentation

## 2021-03-23 LAB — BASIC METABOLIC PANEL
Anion gap: 10 (ref 5–15)
BUN: 14 mg/dL (ref 6–20)
CO2: 23 mmol/L (ref 22–32)
Calcium: 9.1 mg/dL (ref 8.9–10.3)
Chloride: 100 mmol/L (ref 98–111)
Creatinine, Ser: 0.88 mg/dL (ref 0.61–1.24)
GFR, Estimated: >60 mL/min (ref >60)
Glucose, Bld: 99 mg/dL (ref 70–99)
Potassium: 3.4 mmol/L — ABNORMAL LOW (ref 3.5–5.1)
Sodium: 133 mmol/L — ABNORMAL LOW (ref 135–145)

## 2021-03-23 LAB — CBC WITH DIFFERENTIAL/PLATELET
Abs Immature Granulocytes: 0.03 10*3/uL (ref 0.00–0.07)
Basophils Absolute: 0 10*3/uL (ref 0.0–0.1)
Basophils Relative: 0 %
Eosinophils Absolute: 0.1 10*3/uL (ref 0.0–0.5)
Eosinophils Relative: 1 %
HCT: 47.3 % (ref 39.0–52.0)
Hemoglobin: 15.5 g/dL (ref 13.0–17.0)
Immature Granulocytes: 0 %
Lymphocytes Relative: 22 %
Lymphs Abs: 2 10*3/uL (ref 0.7–4.0)
MCH: 29 pg (ref 26.0–34.0)
MCHC: 32.8 g/dL (ref 30.0–36.0)
MCV: 88.4 fL (ref 80.0–100.0)
Monocytes Absolute: 0.4 10*3/uL (ref 0.1–1.0)
Monocytes Relative: 4 %
Neutro Abs: 6.7 10*3/uL (ref 1.7–7.7)
Neutrophils Relative %: 73 %
Platelets: 238 10*3/uL (ref 150–400)
RBC: 5.35 MIL/uL (ref 4.22–5.81)
RDW: 13 % (ref 11.5–15.5)
WBC: 9.2 10*3/uL (ref 4.0–10.5)
nRBC: 0 % (ref 0.0–0.2)

## 2021-03-23 LAB — ETHANOL: Alcohol, Ethyl (B): <10 mg/dL (ref <10)

## 2021-03-23 NOTE — ED Notes (Signed)
Pt on 2 lpm [place]

## 2021-03-23 NOTE — ED Triage Notes (Signed)
Patient with overdose on fentanyl.  Patient was given narcan prior to arrival of EMS by friend intranasally.  Patient is a bit lethargic upon arrival with EMS.  Patient with stable VS upon arrival to Triage.  Non compliant with HTN meds.

## 2021-03-23 NOTE — ED Notes (Signed)
Patient verbalizes understanding of discharge instructions. Opportunity for questioning and answers were provided. Armband removed by staff, pt discharged from ED ambulatory.   

## 2021-03-23 NOTE — ED Provider Notes (Signed)
[facility] EMERGENCY DEPARTMENT Provider Note   CSN: [id number] Arrival date & time: 03/23/21  2026     History Chief Complaint  Patient presents with   Drug Overdose    Gene Gonzalez is a 55 y.o. male.  Presented to the emergency room with concern for overdose.  Patient reports that he has a history of snorting heroin but has been clean for a while.  Was managed with methadone and more recently has been using Suboxone.  He had a relapse this evening with fentanyl.  Snorting.  States he normally does not do fentanyl.  EMS reported overdose on fentanyl, patient received Narcan prior to their arrival.  Patient denies any acute ongoing complaints except feels somewhat sluggish and tired.  No chest pain or difficulty in breathing.  No SI or HI.  HPI     Past Medical History:  Diagnosis Date   Anxiety    Closed dislocation of right ankle 01/09/2020   Depression    GERD (gastroesophageal reflux disease)    Heart murmur    as a child, never has caused any problems   History of heroin abuse (HCC)    Methadone maintenance therapy patient (HCC)    Syndesmotic disruption of ankle, right, initial encounter 01/09/2020   Vitamin D deficiency 01/09/2020    Patient Active Problem List   Diagnosis Date Noted   Vitamin D deficiency 01/09/2020   Syndesmotic disruption of ankle, right, initial encounter 01/09/2020   Closed dislocation of right ankle 01/09/2020   Depression    History of heroin abuse (HCC)    Methadone maintenance therapy patient (HCC)    Closed displaced trimalleolar fracture of right ankle 01/08/2020    Past Surgical History:  Procedure Laterality Date   HARDWARE REMOVAL Right 02/03/2020   Procedure: HARDWARE REMOVAL ANKLE WITH ARTHROTOMY VS ARTHROSCOPY;  Surgeon: Myrene Galas, MD;  Location: [facility] OR;  Service: Orthopedics;  Laterality: Right;   ORIF ANKLE FRACTURE Right 01/08/2020   Procedure: OPEN REDUCTION INTERNAL FIXATION (ORIF) RIGHT ANKLE FRACTURE;   Surgeon: Myrene Galas, MD;  Location: [facility] OR;  Service: Orthopedics;  Laterality: Right;       No family history on file.  Social History   Tobacco Use   Smoking status: Every Day    Packs/day: 0.50    Types: Cigarettes   Smokeless tobacco: Never  Vaping Use   Vaping Use: Never used  Substance Use Topics   Alcohol use: No   Drug use: Not Currently    Home Medications Prior to Admission medications   Medication Sig Start Date End Date Taking? Authorizing Provider  acetaminophen (TYLENOL) 500 MG tablet Take 2 tablets (1,000 mg total) by mouth every 6 (six) hours as needed for mild pain or moderate pain. 02/03/20   Montez Morita, PA-C  atorvastatin (LIPITOR) 10 MG tablet Take 10 mg by mouth at bedtime. 12/09/19   [provider]  citalopram (CELEXA) 40 MG tablet Take 40 mg by mouth daily.    [provider]  dimenhyDRINATE (DRAMAMINE) 50 MG tablet Take 50 mg by mouth every 8 (eight) hours as needed for nausea.    [provider]  docusate sodium (COLACE) 100 MG capsule Take 100 mg by mouth daily as needed for mild constipation.    [provider]  doxepin (SINEQUAN) 75 MG capsule Take 75 mg by mouth at bedtime.  12/09/19   [provider]  ketorolac (TORADOL) 10 MG tablet Take 1 tablet (10 mg total) by mouth  every 6 (six) hours as needed for moderate pain. 02/03/20   Montez Morita, PA-C  methadone (DOLOPHINE) 10 MG/5ML solution Take 88 mg by mouth every morning.     [provider]  methocarbamol (ROBAXIN) 500 MG tablet Take 1-2 tablets (500-1,000 mg total) by mouth every 8 (eight) hours as needed for muscle spasms. 01/09/20   Montez Morita, PA-C  ondansetron (ZOFRAN ODT) 4 MG disintegrating tablet Take 1 tablet (4 mg total) by mouth every 8 (eight) hours as needed for up to 10 doses for nausea or vomiting. 07/06/20   Sabino Donovan, MD  pantoprazole (PROTONIX) 20 MG tablet Take 20 mg by mouth daily. 11/05/19   [provider]  polyethylene  glycol (MIRALAX / GLYCOLAX) packet Take 17 g by mouth daily as needed for mild constipation.     [provider]  pramipexole (MIRAPEX) 0.125 MG tablet Take 0.125 mg by mouth at bedtime. 12/09/19   [provider]  venlafaxine XR (EFFEXOR-XR) 150 MG 24 hr capsule Take 150 mg by mouth daily. 12/09/19   [provider]  Vitamin D, Ergocalciferol, (DRISDOL) 1.25 MG (50000 UNIT) CAPS capsule Take 50,000 Units by mouth once a week. Monday 12/09/19   [provider]    Allergies    Smallpox vaccine and Ciprofloxacin  Review of Systems   Review of Systems  Constitutional:  Positive for fatigue. Negative for chills and fever.  HENT:  Negative for ear pain and sore throat.   Eyes:  Negative for pain and visual disturbance.  Respiratory:  Negative for cough and shortness of breath.   Cardiovascular:  Negative for chest pain and palpitations.  Gastrointestinal:  Negative for abdominal pain and vomiting.  Genitourinary:  Negative for dysuria and hematuria.  Musculoskeletal:  Negative for arthralgias and back pain.  Skin:  Negative for color change and rash.  Neurological:  Negative for seizures and syncope.  All other systems reviewed and are negative.  Physical Exam Updated Vital Signs BP (!) 146/76   Pulse 74   Temp 99.4 F (37.4 C) (Oral)   Resp 10   SpO2 96%   Physical Exam Vitals and nursing note reviewed.  Constitutional:      Appearance: He is well-developed.  HENT:     Head: Normocephalic and atraumatic.  Eyes:     Conjunctiva/sclera: Conjunctivae normal.  Cardiovascular:     Rate and Rhythm: Normal rate and regular rhythm.     Heart sounds: No murmur heard. Pulmonary:     Effort: Pulmonary effort is normal. No respiratory distress.     Breath sounds: Normal breath sounds.  Abdominal:     Palpations: Abdomen is soft.     Tenderness: There is no abdominal tenderness.  Musculoskeletal:     Cervical back: Neck supple.  Skin:    General: Skin  is warm and dry.  Neurological:     General: No focal deficit present.     Mental Status: He is alert.  Psychiatric:        Mood and Affect: Mood normal.        Thought Content: Thought content normal.    ED Results / Procedures / Treatments   Labs (all labs ordered are listed, but only abnormal results are displayed) Labs Reviewed  BASIC METABOLIC PANEL - Abnormal; Notable for the following components:      Result Value   Sodium 133 (*)    Potassium 3.4 (*)    All other components within normal limits  CBC  WITH DIFFERENTIAL/PLATELET  ETHANOL  RAPID URINE DRUG SCREEN, HOSP PERFORMED    EKG EKG Interpretation  Date/Time:  Wednesday March 23 2021 23:07:59 EDT Ventricular Rate:  69 PR Interval:  139 QRS Duration: 102 QT Interval:  425 QTC Calculation: 456 R Axis:   30 Text Interpretation: Sinus rhythm Abnormal R-wave progression, early transition Borderline T wave abnormalities Confirmed by Marianna Fuss ([id number]) on 03/23/2021 11:11:05 PM  Radiology DG Chest Portable 1 View  Result Date: 03/23/2021 CLINICAL DATA:  Overdose.  Concern for aspiration.  Lethargy. EXAM: PORTABLE CHEST 1 VIEW COMPARISON:  08/25/2020 FINDINGS: Upper normal heart size. Stable mediastinal contours. Subsegmental atelectasis at the left lung base. The right lung is clear. No pulmonary edema, pleural effusion or pneumothorax. No acute osseous abnormalities are IMPRESSION: Subsegmental atelectasis at the left lung base. Electronically Signed   By: Narda Rutherford M.D.   On: 03/23/2021 21:20    Procedures Procedures   Medications Ordered in ED Medications - No data to display  ED Course  I have reviewed the triage vital signs and the nursing notes.  Pertinent labs & imaging results that were available during my care of the patient were reviewed by me and considered in my medical decision making (see chart for details).    MDM Rules/Calculators/A&P                          55 year old male  presents to ER with concern for fentanyl overdose.  Had received Narcan prior to arrival per EMS report.  Here patient was alert, did not require any additional Narcan.  Basic labs are stable.  Patient was observed for a couple hours.  No evidence of recurrence of overdose symptoms.  Offered Narcan prescription but patient states he already has 1 at home.   Patient ambulatory, tolerating p.o. without difficulty.  Will discharge home.  Final Clinical Impression(s) / ED Diagnoses Final diagnoses:  Opiate overdose, accidental or unintentional, initial encounter [facility])    Rx / DC Orders ED Discharge Orders     None        Milagros Loll, MD 03/23/21 2311

## 2021-03-23 NOTE — Discharge Instructions (Signed)
Please follow-up with your Suboxone clinic.  Come back to ER if you develop any difficulty in breathing, lethargy or other new concerning symptom.

## 2021-07-19 IMAGING — CR DG ANKLE COMPLETE 3+V*R*
3 series · 3 of 3 positions shown · non-contrast
Comparison: None.

CLINICAL DATA: 53-year-old male status post injury with pain.

EXAM:
RIGHT ANKLE - COMPLETE 3+ VIEW

[ankle ap]
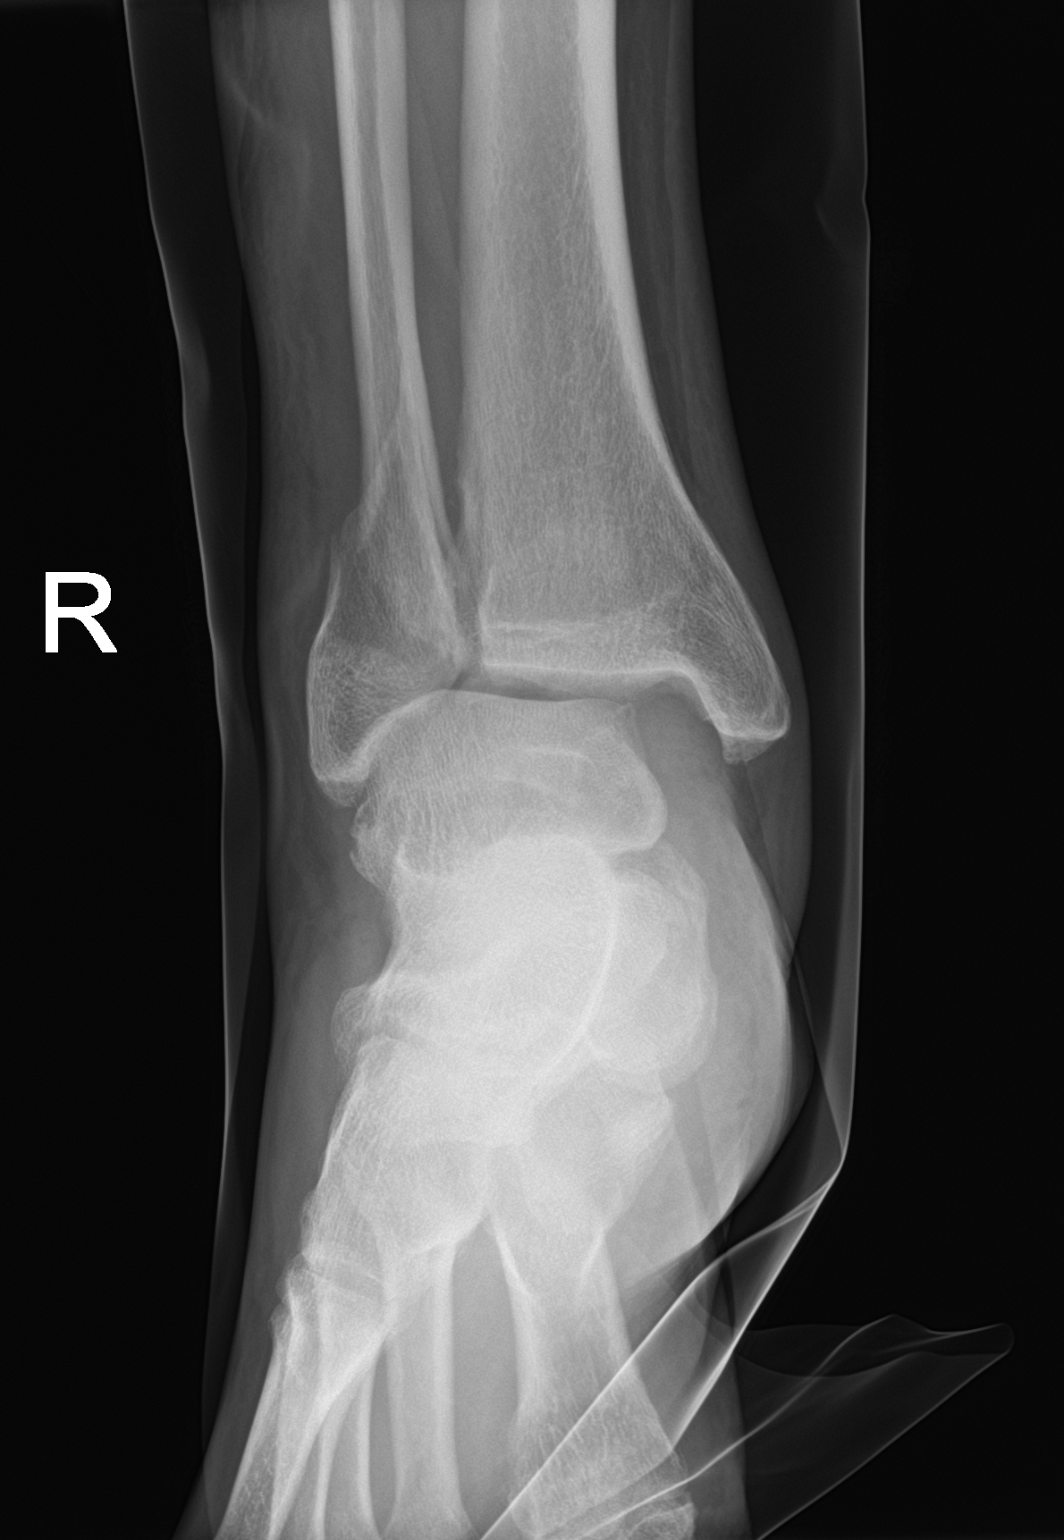

[ankle obl]
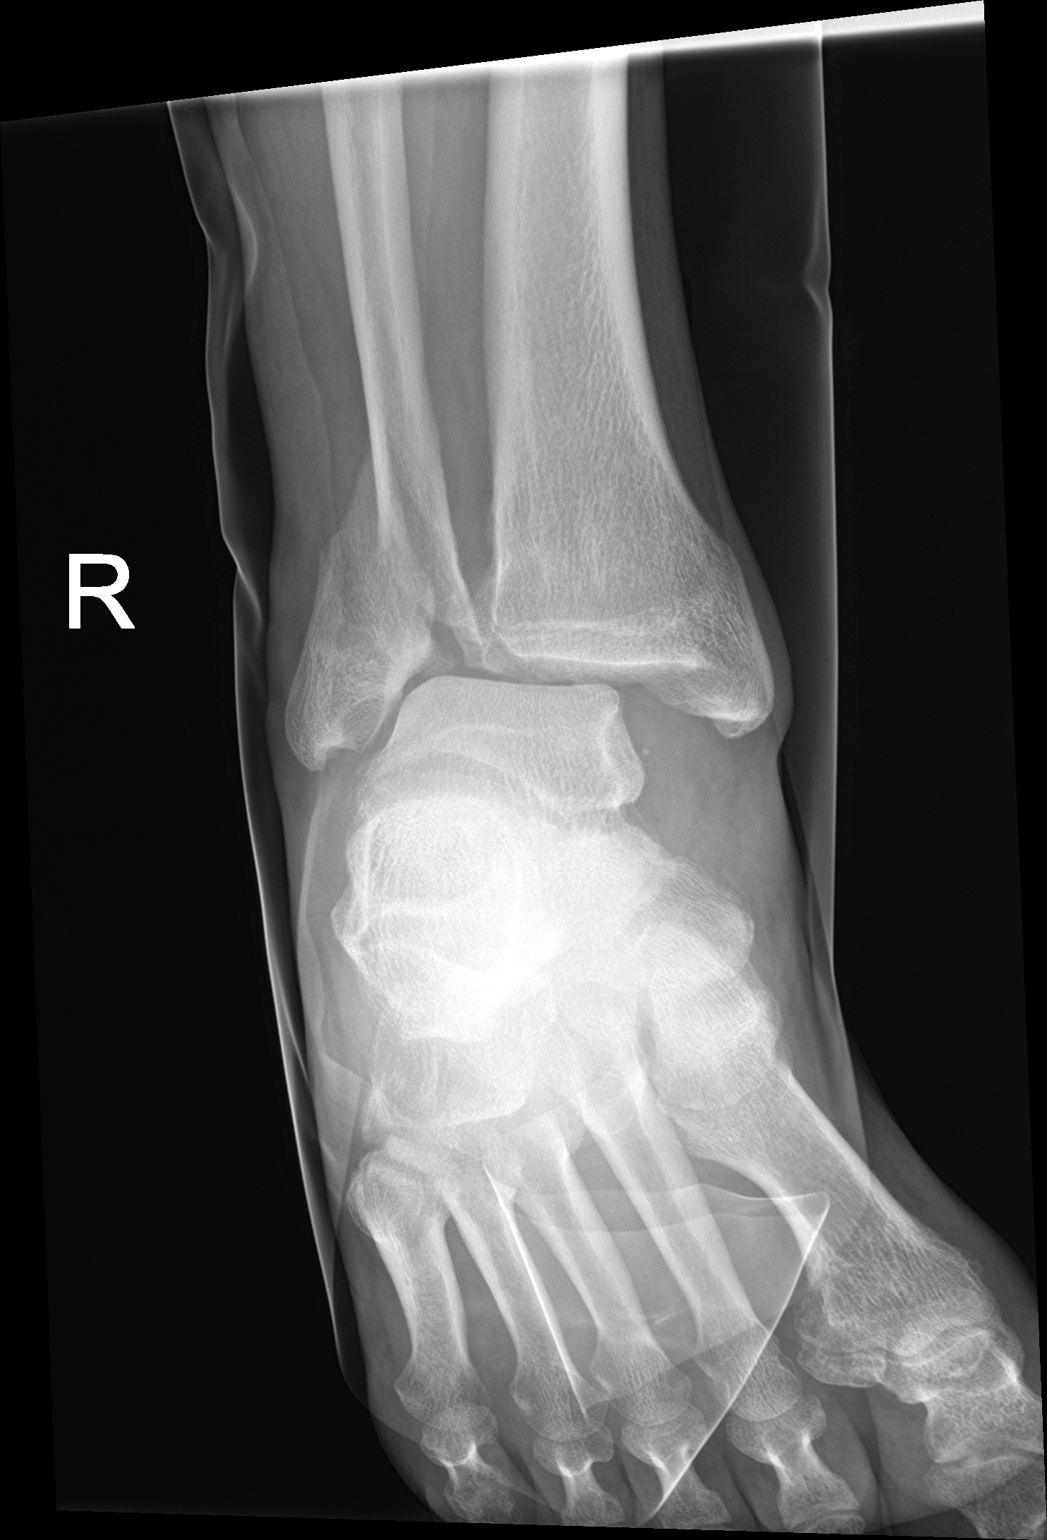

[ankle lat]
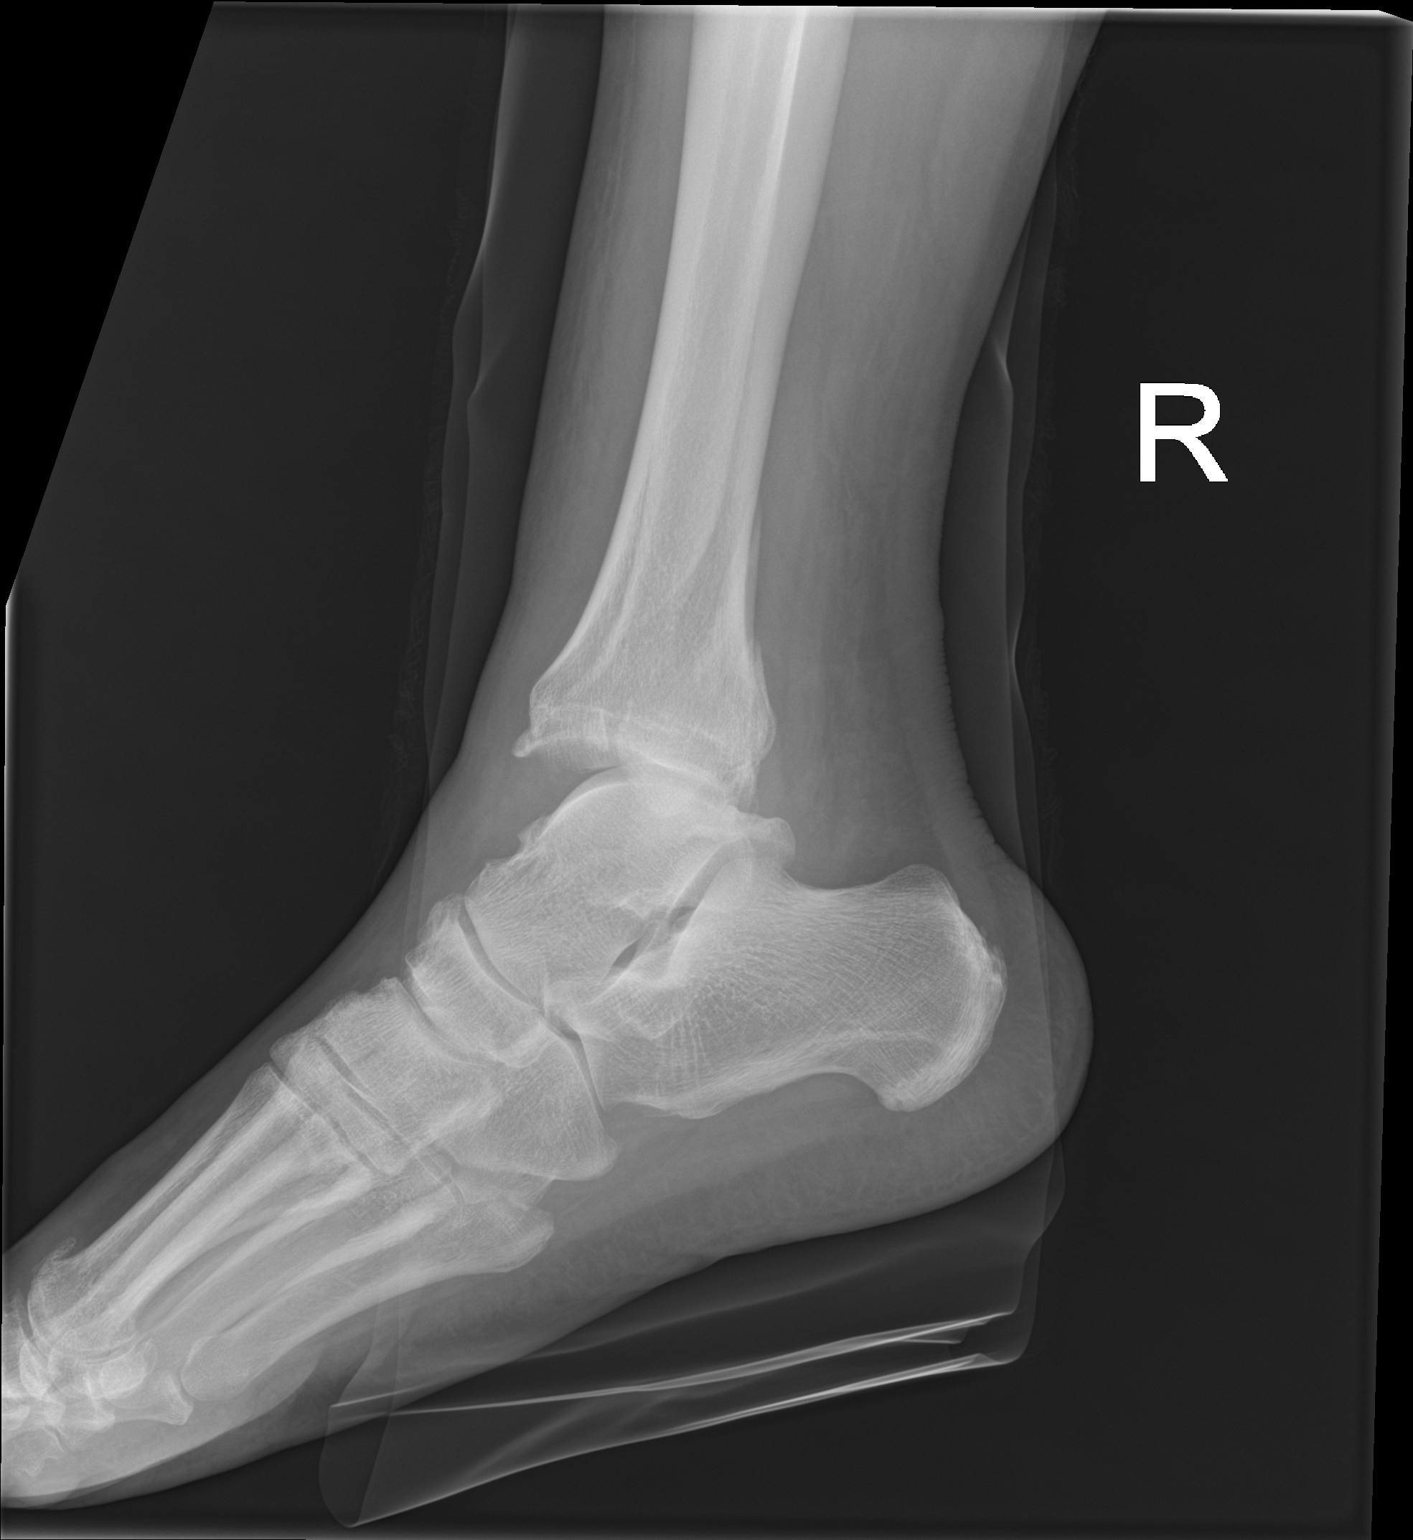

[3 of 3 positions shown; findings below may reference images not displayed]

FINDINGS: Spiral fracture through the distal right fibula metadiaphysis
tracking into the mortise joint. That fracture is laterally
displaced, angulated and mildly overriding. Lateral and slightly
posterior dislocation of the mortise joint. Evidence of associated
minimally displaced posterior malleolus fracture.

The medial malleolus appears to remain intact. Talar dome and
calcaneus appear intact. Other visible bones of the right foot
appear intact.
IMPRESSION: Right ankle fractures with displaced spiral fracture through the
distal right fibula metadiaphysis, and minimally displaced posterior
malleolus fracture. Lateral and slightly posterior ankle
dislocation.

## 2021-11-29 ENCOUNTER — Emergency Department (HOSPITAL_COMMUNITY)
Admission: EM | Admit: 2021-11-29 | Discharge: 2021-11-30 | Disposition: A | Discharge status: Home / Self Care | Payer: 59 | Attending: Emergency Medicine | Admitting: Emergency Medicine

## 2021-11-29 ENCOUNTER — Other Ambulatory Visit: Payer: Self-pay

## 2021-11-29 ENCOUNTER — Emergency Department (HOSPITAL_COMMUNITY): Payer: 59

## 2021-11-29 DIAGNOSIS — R799 Abnormal finding of blood chemistry, unspecified: Secondary | ICD-10-CM | POA: Insufficient documentation

## 2021-11-29 DIAGNOSIS — X58XXXA Exposure to other specified factors, initial encounter: Secondary | ICD-10-CM | POA: Insufficient documentation

## 2021-11-29 DIAGNOSIS — T402X4A Poisoning by other opioids, undetermined, initial encounter: Secondary | ICD-10-CM | POA: Diagnosis not present

## 2021-11-29 DIAGNOSIS — T50904A Poisoning by unspecified drugs, medicaments and biological substances, undetermined, initial encounter: Secondary | ICD-10-CM

## 2021-11-29 LAB — CBC WITH DIFFERENTIAL/PLATELET
Abs Immature Granulocytes: 0.03 10*3/uL (ref 0.00–0.07)
Basophils Absolute: 0 10*3/uL (ref 0.0–0.1)
Basophils Relative: 0 %
Eosinophils Absolute: 0.3 10*3/uL (ref 0.0–0.5)
Eosinophils Relative: 4 %
HCT: 45.3 % (ref 39.0–52.0)
Hemoglobin: 15 g/dL (ref 13.0–17.0)
Immature Granulocytes: 0 %
Lymphocytes Relative: 51 %
Lymphs Abs: 3.5 10*3/uL (ref 0.7–4.0)
MCH: 29.5 pg (ref 26.0–34.0)
MCHC: 33.1 g/dL (ref 30.0–36.0)
MCV: 89 fL (ref 80.0–100.0)
Monocytes Absolute: 0.7 10*3/uL (ref 0.1–1.0)
Monocytes Relative: 9 %
Neutro Abs: 2.5 10*3/uL (ref 1.7–7.7)
Neutrophils Relative %: 36 %
Platelets: 197 10*3/uL (ref 150–400)
RBC: 5.09 MIL/uL (ref 4.22–5.81)
RDW: 12.8 % (ref 11.5–15.5)
WBC: 7 10*3/uL (ref 4.0–10.5)
nRBC: 0 % (ref 0.0–0.2)

## 2021-11-29 LAB — COMPREHENSIVE METABOLIC PANEL
ALT: 46 U/L — ABNORMAL HIGH (ref 0–44)
AST: 41 U/L (ref 15–41)
Albumin: 3.9 g/dL (ref 3.5–5.0)
Alkaline Phosphatase: 103 U/L (ref 38–126)
Anion gap: 8 (ref 5–15)
BUN: 11 mg/dL (ref 6–20)
CO2: 30 mmol/L (ref 22–32)
Calcium: 9.3 mg/dL (ref 8.9–10.3)
Chloride: 97 mmol/L — ABNORMAL LOW (ref 98–111)
Creatinine, Ser: 0.88 mg/dL (ref 0.61–1.24)
GFR, Estimated: >60 mL/min (ref >60)
Glucose, Bld: 103 mg/dL — ABNORMAL HIGH (ref 70–99)
Potassium: 4.8 mmol/L (ref 3.5–5.1)
Sodium: 135 mmol/L (ref 135–145)
Total Bilirubin: 0.5 mg/dL (ref 0.3–1.2)
Total Protein: 7.9 g/dL (ref 6.5–8.1)

## 2021-11-29 LAB — TROPONIN I (HIGH SENSITIVITY): Troponin I (High Sensitivity): 3 ng/L (ref <18)

## 2021-11-29 LAB — CBG MONITORING, ED: Glucose-Capillary: 110 mg/dL — ABNORMAL HIGH (ref 70–99)

## 2021-11-29 MED ORDER — NALOXONE HCL 4 MG/0.1ML NA LIQD
1.0000 | Freq: Once | NASAL | Status: AC
  Administered 2021-11-29: 1 via NASAL
  Filled 2021-11-29: qty 4

## 2021-11-29 MED ORDER — NALOXONE HCL 0.4 MG/ML IJ SOLN: 0.4000 mg | Freq: Once | INTRAMUSCULAR | Status: DC

## 2021-11-29 MED ORDER — ONDANSETRON HCL 4 MG/2ML IJ SOLN
4.0000 mg | Freq: Once | INTRAMUSCULAR | Status: AC
  Administered 2021-11-29: 4 mg via INTRAVENOUS
  Filled 2021-11-29: qty 2

## 2021-11-29 NOTE — ED Notes (Signed)
Patient alert and able to answer questions.  Reports has no ride home and will need to take buses in the morning.  MD and Charge RN aware ?

## 2021-11-29 NOTE — ED Notes (Signed)
Pt continues to sleep soundly.

## 2021-11-29 NOTE — ED Provider Notes (Signed)
Signout from Dr. Armandina Gemma.  56 year old male here after a drug overdose with benzos and fentanyl.  Accidental.  Plan is to let him metabolize and reassess.  Likely discharge with outpatient resources. ?Physical Exam  ?BP 127/81   Pulse 68   Resp 11   Ht 5\' 9"  (1.753 m)   Wt 90.7 kg   SpO2 99%   BMI 29.53 kg/m?  ? ?Physical Exam ? ?Procedures  ?Procedures ? ?ED Course / MDM  ?  ?Medical Decision Making ?Amount and/or Complexity of Data Reviewed ?Labs: ordered. ?Radiology: ordered. ? ?Risk ?Prescription drug management. ? ? ?20:00 -assessment, patient arousable to voice.  Still falls back to sleep.  Still on oxygen.  Not appropriate for discharge at this time. ? ?22:20 -patient now awake alert oriented.  States he was using fentanyl.  Has been off oxygen and saturating well.  Will discharge.  Given resources for substance use ? ? ?  ?Hayden Rasmussen, MD ?11/30/21 1352 ? ?

## 2021-11-29 NOTE — ED Notes (Signed)
Verbal order given for non-violent restraints ?

## 2021-11-29 NOTE — ED Triage Notes (Signed)
Per EMS, Pt, from home, presents after a Fentanyl and Xanax overdose.  Pt denied SI w/ EMS.  Pt was A&Ox4 and ambulatory on EMS arrival, but has become more lethargic en route.    ?

## 2021-11-29 NOTE — ED Notes (Signed)
Attempted to arouse Pt and ask about a ride.  Pt opened eyes to voice and was able to acknowledge this writer's questions, but almost immediately fell back to sleep.  ?

## 2021-11-29 NOTE — ED Notes (Signed)
Pt started violently vomiting and thrashing around in bed.   ?

## 2021-11-29 NOTE — ED Notes (Signed)
Restraints removed and O2 turned off.  Pt has been sleeping soundly for several hours. ? ?Pt briefly woke up to use urinal and ask this Clinical research associate what happened.  Pt promptly went back to sleep.  ?

## 2021-11-29 NOTE — ED Notes (Signed)
Able to arouse patient.  Patient will open eyes and go back to sleep.  Respirations even and unlabored.  Will continue to monitor ?

## 2021-11-29 NOTE — Discharge Instructions (Signed)
You were seen in the emergency department after an overdose.  You were observed for 10 hours with improvement in your symptoms.  Please consider detox.  Return if any worsening or concerning symptoms. ?

## 2021-11-29 NOTE — ED Provider Notes (Signed)
?[facility]-EMERGENCY DEPT ?Provider Note ? ? ?CSN: [id number] ?Arrival date & time: 11/29/21  1211 ? ?  ? ?History ? ?Chief Complaint  ?Patient presents with  ? Drug Overdose  ? ? ?Walker Sitar is a 56 y.o. male. ? ? ?Drug Overdose ? ? ?56 year old male with medical history significant for depression, on methadone maintenance therapy, history of heroin abuse, GERD, anxiety who presents to the emergency department with a complaint of opiate overdose.  The patient history was obtained by EMS as the patient was unable to provide a history.  He presents after a reported fentanyl and Xanax overdose.  Initially was arousable with EMS and denied SI.  He was alert and oriented x4 and ambulatory on EMS arrival but became more lethargic and route.  EMS did not administer Narcan.  He was protecting his airway and arrived to the emergency department ABC intact. ? ?Home Medications ?Prior to Admission medications   ?Medication Sig Start Date End Date Taking? Authorizing Provider  ?acetaminophen (TYLENOL) 500 MG tablet Take 2 tablets (1,000 mg total) by mouth every 6 (six) hours as needed for mild pain or moderate pain. 02/03/20   Montez Morita, PA-C  ?atorvastatin (LIPITOR) 10 MG tablet Take 10 mg by mouth at bedtime. 12/09/19   [provider]  ?citalopram (CELEXA) 40 MG tablet Take 40 mg by mouth daily.    [provider]  ?dimenhyDRINATE (DRAMAMINE) 50 MG tablet Take 50 mg by mouth every 8 (eight) hours as needed for nausea.    [provider]  ?docusate sodium (COLACE) 100 MG capsule Take 100 mg by mouth daily as needed for mild constipation.    [provider]  ?doxepin (SINEQUAN) 75 MG capsule Take 75 mg by mouth at bedtime.  12/09/19   [provider]  ?ketorolac (TORADOL) 10 MG tablet Take 1 tablet (10 mg total) by mouth every 6 (six) hours as needed for moderate pain. 02/03/20   Montez Morita, PA-C  ?methadone (DOLOPHINE) 10 MG/5ML solution Take 88 mg by mouth every  morning.     [provider]  ?methocarbamol (ROBAXIN) 500 MG tablet Take 1-2 tablets (500-1,000 mg total) by mouth every 8 (eight) hours as needed for muscle spasms. 01/09/20   Montez Morita, PA-C  ?ondansetron (ZOFRAN ODT) 4 MG disintegrating tablet Take 1 tablet (4 mg total) by mouth every 8 (eight) hours as needed for up to 10 doses for nausea or vomiting. 07/06/20   Sabino Donovan, MD  ?pantoprazole (PROTONIX) 20 MG tablet Take 20 mg by mouth daily. 11/05/19   [provider]  ?polyethylene glycol (MIRALAX / GLYCOLAX) packet Take 17 g by mouth daily as needed for mild constipation.     [provider]  ?pramipexole (MIRAPEX) 0.125 MG tablet Take 0.125 mg by mouth at bedtime. 12/09/19   [provider]  ?venlafaxine XR (EFFEXOR-XR) 150 MG 24 hr capsule Take 150 mg by mouth daily. 12/09/19   [provider]  ?Vitamin D, Ergocalciferol, (DRISDOL) 1.25 MG (50000 UNIT) CAPS capsule Take 50,000 Units by mouth once a week. Monday 12/09/19   [provider]  ?   ? ?Allergies    ?Smallpox virus vaccine live and Ciprofloxacin   ? ?Review of Systems   ?Review of Systems  ?Unable to perform ROS: Mental status change  ? ?Physical Exam ?Updated Vital Signs ?BP 118/69   Pulse 68   Resp 11   Ht 5\' 9"  (1.753 m)   Wt 90.7 kg  SpO2 100%   BMI 29.53 kg/m?  ?Physical Exam ?Vitals and nursing note reviewed.  ?Constitutional:   ?   Comments: Somnolent, arousable to painful stimuli, GCS 9.   ?HENT:  ?   Head: Normocephalic and atraumatic.  ?Eyes:  ?   Conjunctiva/sclera: Conjunctivae normal.  ?   Comments: Pupils pinpoint and minimally reactive bilaterally  ?Cardiovascular:  ?   Rate and Rhythm: Normal rate and regular rhythm.  ?Pulmonary:  ?   Effort: Pulmonary effort is normal. No respiratory distress.  ?   Breath sounds: No wheezing or rhonchi.  ?   Comments: Bradypnea present ?Abdominal:  ?   General: There is no distension.  ?   Tenderness: There is no abdominal tenderness. There is  no guarding.  ?Musculoskeletal:     ?   General: No deformity or signs of injury.  ?   Cervical back: Neck supple.  ?Skin: ?   Findings: No lesion or rash.  ?Neurological:  ?   General: No focal deficit present.  ?   Comments: Withdrawing all 4 extremities to painful stimuli, will follow commands, cross midline.  No cranial nerve deficit  ? ? ?ED Results / Procedures / Treatments   ?Labs ?(all labs ordered are listed, but only abnormal results are displayed) ?Labs Reviewed  ?COMPREHENSIVE METABOLIC PANEL - Abnormal; Notable for the following components:  ?    Result Value  ? Chloride 97 (*)   ? Glucose, Bld 103 (*)   ? ALT 46 (*)   ? All other components within normal limits  ?CBG MONITORING, ED - Abnormal; Notable for the following components:  ? Glucose-Capillary 110 (*)   ? All other components within normal limits  ?CBC WITH DIFFERENTIAL/PLATELET  ?TROPONIN I (HIGH SENSITIVITY)  ? ? ?EKG ?EKG Interpretation ? ?Date/Time:  Tuesday November 29 2021 13:29:49 EDT ?Ventricular Rate:  62 ?PR Interval:  139 ?QRS Duration: 115 ?QT Interval:  391 ?QTC Calculation: 397 ?R Axis:   48 ?Text Interpretation: Sinus rhythm Atrial premature complex Nonspecific intraventricular conduction delay Confirmed by Ernie AvenaLawsing, Tauren Delbuono ([id number]) on 11/29/2021 1:54:12 PM ? ?Radiology ?DG Chest Portable 1 View ? ?Result Date: 11/29/2021 ?CLINICAL DATA:  Drug overdose, vomiting EXAM: PORTABLE CHEST 1 VIEW COMPARISON:  03/23/2021 FINDINGS: Transverse diameter of heart is increased. Thoracic aorta is tortuous. There are no signs of pulmonary edema or focal pulmonary consolidation. Left lateral CP angle is not included in its entirety. There is no significant pleural effusion or pneumothorax. IMPRESSION: No active disease. Electronically Signed   By: Ernie AvenaPalani  Rathinasamy M.D.   On: 11/29/2021 13:32   ? ?Procedures ?Marland Kitchen.Critical Care ?Performed by: Ernie AvenaLawsing, Kyce Ging, MD ?Authorized by: Ernie AvenaLawsing, Donat Humble, MD  ? ?Critical care provider statement:  ?  Critical care time  (minutes):  30 ?  Critical care was necessary to treat or prevent imminent or life-threatening deterioration of the following conditions:  Toxidrome ?  Critical care was time spent personally by me on the following activities:  Examination of patient, obtaining history from patient or surrogate, ordering and performing treatments and interventions, ordering and review of laboratory studies, ordering and review of radiographic studies, pulse oximetry and re-evaluation of patient's condition  ? ? ?Medications Ordered in ED ?Medications  ?naloxone (NARCAN) injection 0.4 mg (has no administration in time range)  ?naloxone (NARCAN) nasal spray 4 mg/0.1 mL (1 spray Nasal Provided for home use 11/29/21 1240)  ?ondansetron (ZOFRAN) injection 4 mg (4 mg Intravenous Given 11/29/21 1331)  ? ? ?ED Course/ Medical Decision  Making/ A&P ?  ?                        ?Medical Decision Making ?Amount and/or Complexity of Data Reviewed ?Labs: ordered. ?Radiology: ordered. ? ?Risk ?Prescription drug management. ? ? ? ?56 year old male with medical history significant for depression, on methadone maintenance therapy, history of heroin abuse, GERD, anxiety who presents to the emergency department with a complaint of opiate overdose.  The patient history was obtained by EMS as the patient was unable to provide a history.  He presents after a reported fentanyl and Xanax overdose.  Initially was arousable with EMS and denied SI.  He was alert and oriented x4 and ambulatory on EMS arrival but became more lethargic and route.  EMS did not administer Narcan.  He was protecting his airway and arrived to the emergency department ABC intact. ? ?On arrival, the patient was GCS 9, pupils pinpoint and minimally reactive, bradypnea present.  Concern for opiate overdose.  The patient was administered intranasal Narcan with good response.  He did have an episode of nausea and vomiting after Narcan administration and was administered Zofran.  He was placed  on end-tidal CO2 monitoring and allowed time to metabolize.  He did not require further dosing of Narcan in the ED and maintained his airway.  Given his episode of vomiting, an EKG was obtained which revealed n

## 2021-11-30 NOTE — ED Notes (Signed)
Patient reports no complaints.  Currently alert and oriented.  Was able to eat a sandwich and drink without nausea/vomiting.   ?

## 2021-11-30 NOTE — ED Notes (Signed)
Patient alert and able to ambulate to restroom ?

## 2023-09-21 ENCOUNTER — Encounter: Payer: Self-pay | Admitting: Physician Assistant
